# Patient Record
Sex: Male | Born: 1985 | Race: White | Hispanic: No | Marital: Married | Smoking: Never smoker
Health system: Southern US, Community
[De-identification: ages and names within clinical notes are randomized; demographics above are authoritative.]

## PROBLEM LIST (undated history)

## (undated) HISTORY — PX: VASECTOMY: SHX75

## (undated) HISTORY — PX: OTHER SURGICAL HISTORY: SHX169

---

## 2008-12-11 ENCOUNTER — Ambulatory Visit: Payer: Self-pay | Admitting: Internal Medicine

## 2010-11-21 IMAGING — CR RIGHT ANKLE - COMPLETE 3+ VIEW
1 series · 4 of 4 positions shown · non-contrast
Comparison: none

REASON FOR EXAM: swelling
COMMENTS:

PROCEDURE:     MDR - MDR ANKLE RIGHT COMPLETE  - December 11, 2008 [DATE]
RESULT:     Soft tissue swelling is appreciated within the medial and
lateral malleolar regions. There is no evidence of acute fracture or
dislocation.

[Series 1: view not recorded · 0.17mm/px · 4 of 4 slices shown]
[im 1/4]
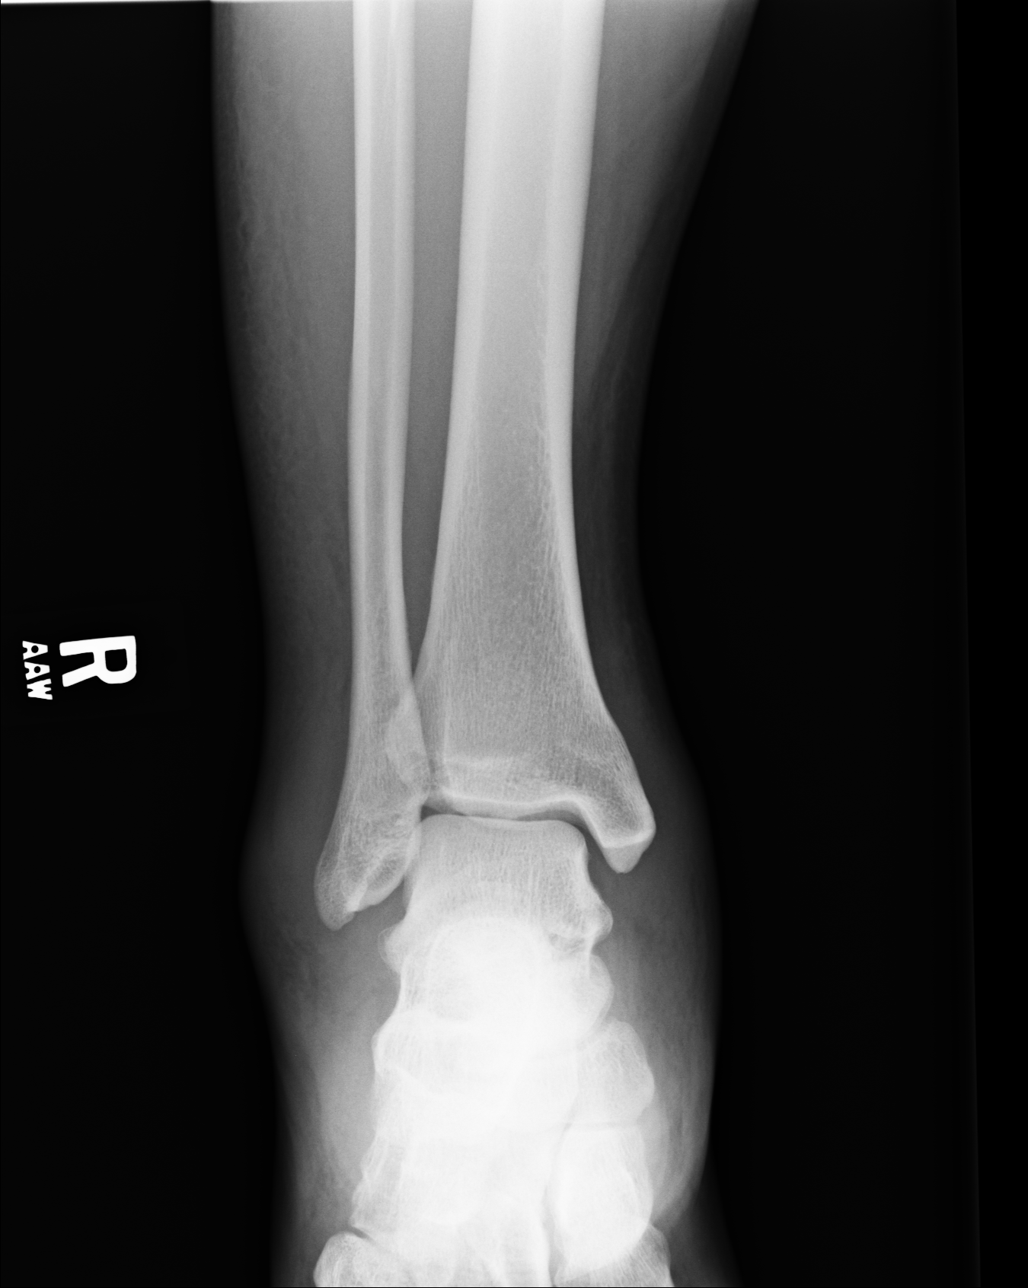
[im 2/4]
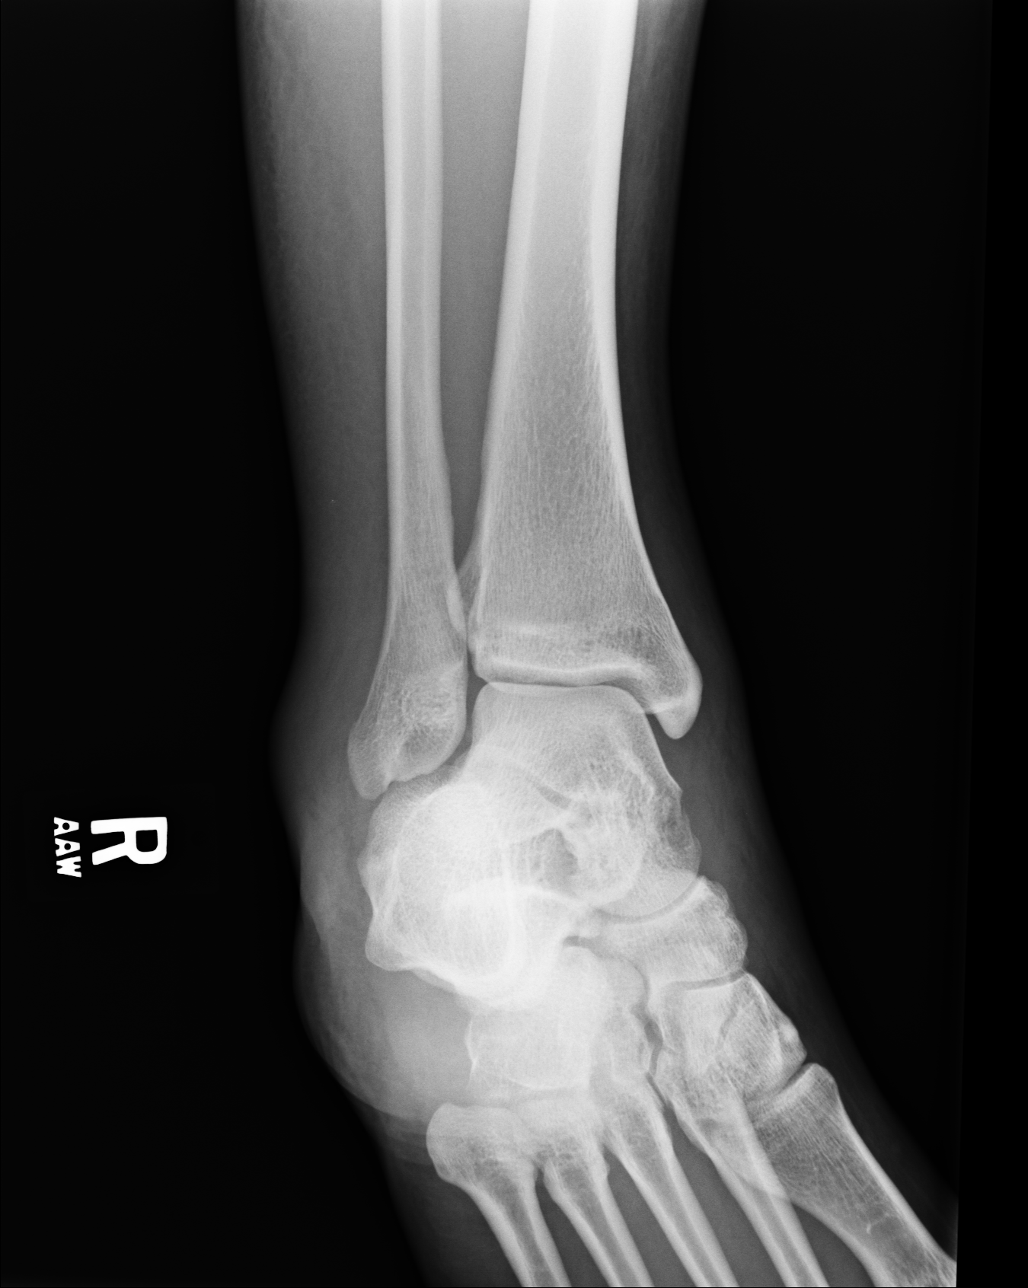
[im 3/4]
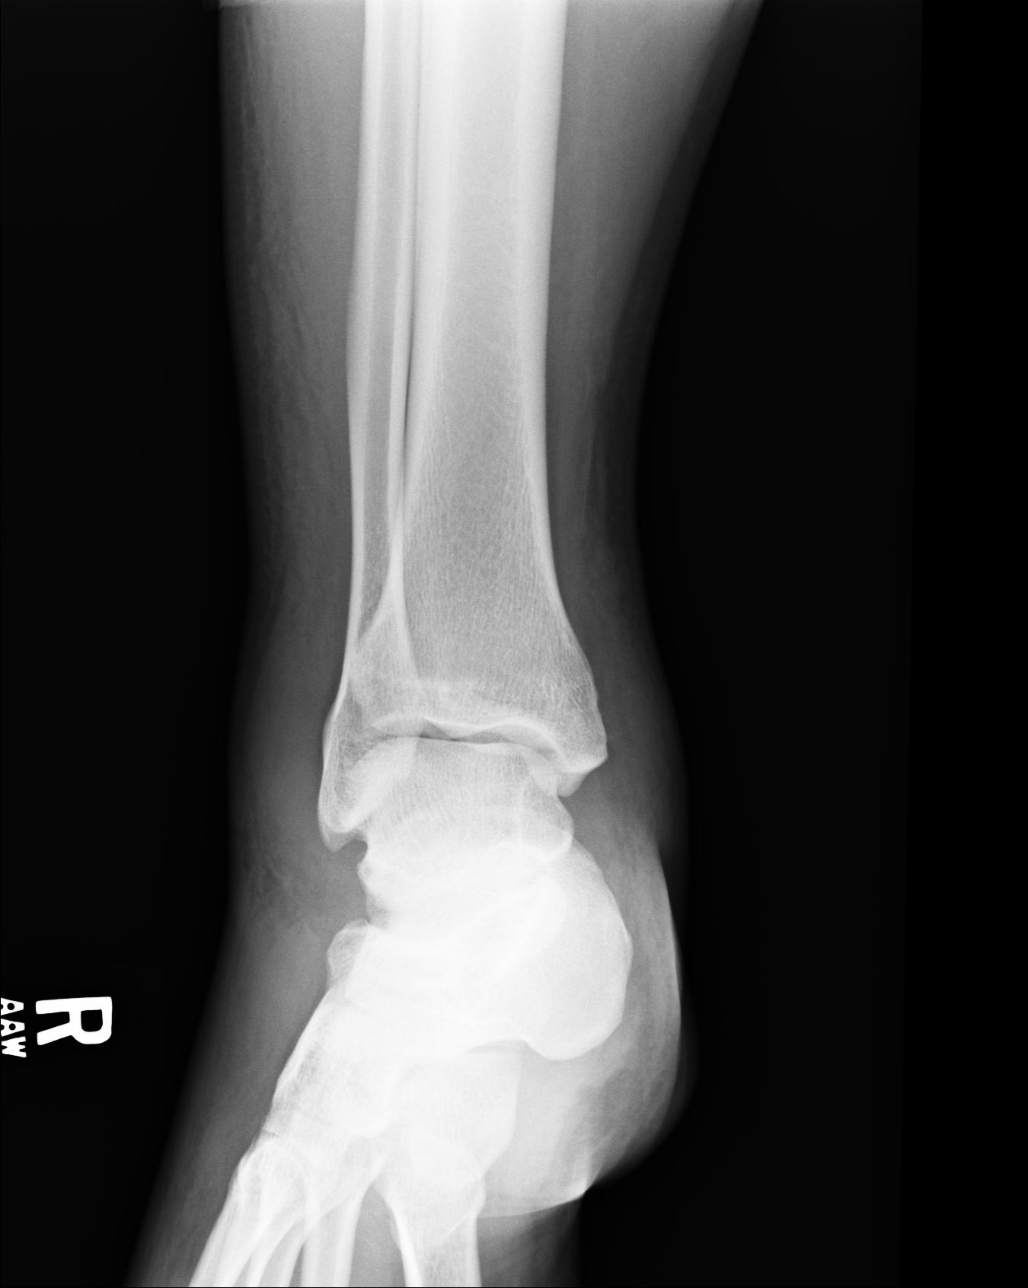
[im 4/4]
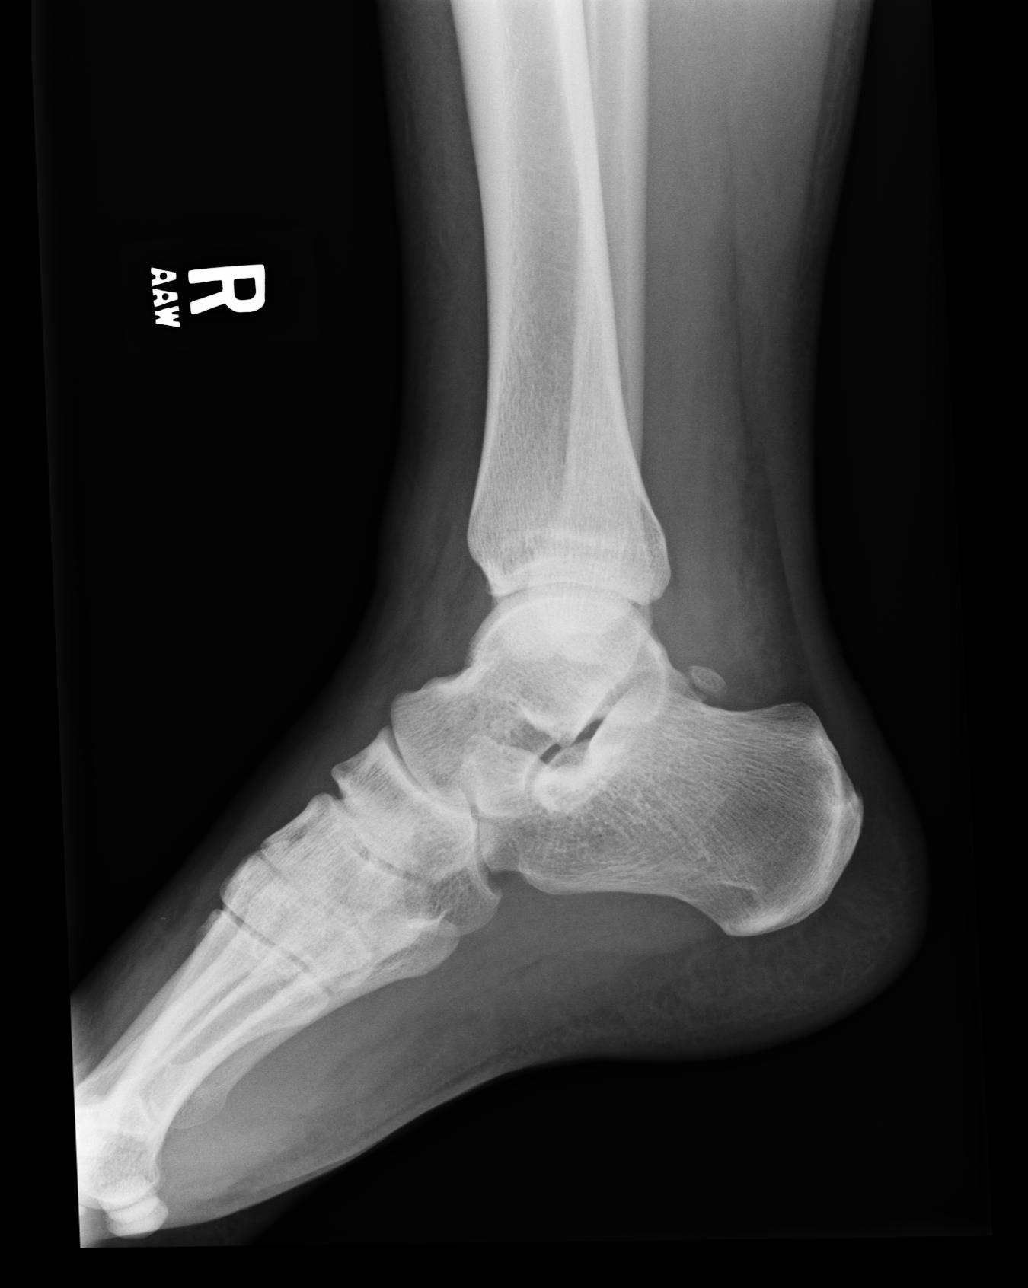

[4 of 4 positions shown; findings below may reference images not displayed]

IMPRESSION: 1. Findings possibly representing ligamentous injury within the medial and
lateral malleolar regions. No evidence of acute osseous abnormality is
appreciated.

## 2017-05-05 DIAGNOSIS — G510 Bell's palsy: Secondary | ICD-10-CM | POA: Diagnosis not present

## 2017-05-05 DIAGNOSIS — R202 Paresthesia of skin: Secondary | ICD-10-CM | POA: Diagnosis not present

## 2017-05-09 DIAGNOSIS — B349 Viral infection, unspecified: Secondary | ICD-10-CM | POA: Diagnosis not present

## 2017-05-09 DIAGNOSIS — G510 Bell's palsy: Secondary | ICD-10-CM | POA: Diagnosis not present

## 2019-10-19 DIAGNOSIS — Z131 Encounter for screening for diabetes mellitus: Secondary | ICD-10-CM | POA: Diagnosis not present

## 2019-10-19 DIAGNOSIS — Z1211 Encounter for screening for malignant neoplasm of colon: Secondary | ICD-10-CM | POA: Diagnosis not present

## 2019-10-19 DIAGNOSIS — E669 Obesity, unspecified: Secondary | ICD-10-CM | POA: Diagnosis not present

## 2019-10-19 DIAGNOSIS — I10 Essential (primary) hypertension: Secondary | ICD-10-CM | POA: Diagnosis not present

## 2019-11-12 DIAGNOSIS — I10 Essential (primary) hypertension: Secondary | ICD-10-CM | POA: Diagnosis not present

## 2019-11-12 DIAGNOSIS — Z6837 Body mass index (BMI) 37.0-37.9, adult: Secondary | ICD-10-CM | POA: Diagnosis not present

## 2019-11-12 DIAGNOSIS — E785 Hyperlipidemia, unspecified: Secondary | ICD-10-CM | POA: Diagnosis not present

## 2020-02-11 DIAGNOSIS — I10 Essential (primary) hypertension: Secondary | ICD-10-CM | POA: Diagnosis not present

## 2020-02-11 DIAGNOSIS — E785 Hyperlipidemia, unspecified: Secondary | ICD-10-CM | POA: Diagnosis not present

## 2020-02-11 DIAGNOSIS — Z6838 Body mass index (BMI) 38.0-38.9, adult: Secondary | ICD-10-CM | POA: Diagnosis not present

## 2020-08-10 DIAGNOSIS — I10 Essential (primary) hypertension: Secondary | ICD-10-CM | POA: Diagnosis not present

## 2020-08-10 DIAGNOSIS — R1011 Right upper quadrant pain: Secondary | ICD-10-CM | POA: Diagnosis not present

## 2020-08-10 DIAGNOSIS — E785 Hyperlipidemia, unspecified: Secondary | ICD-10-CM | POA: Diagnosis not present

## 2021-08-24 ENCOUNTER — Ambulatory Visit (INDEPENDENT_AMBULATORY_CARE_PROVIDER_SITE_OTHER): Payer: 59 | Admitting: Urology

## 2021-08-24 ENCOUNTER — Other Ambulatory Visit: Payer: Self-pay

## 2021-08-24 VITALS — BP 141/92 | Ht 73.0 in | Wt 286.0 lb

## 2021-08-24 DIAGNOSIS — Z3009 Encounter for other general counseling and advice on contraception: Secondary | ICD-10-CM

## 2021-08-24 NOTE — Patient Instructions (Signed)

## 2021-08-26 ENCOUNTER — Encounter: Payer: Self-pay | Admitting: Urology

## 2021-08-26 MED ORDER — DIAZEPAM 10 MG PO TABS: ORAL_TABLET | ORAL | 0 refills | Status: DC

## 2021-08-26 NOTE — Progress Notes (Signed)
08/24/2021 1:30 PM   Adam Short [birth year] BP:[id number]  Referring provider: No referring provider defined for this encounter.  Chief Complaint  Patient presents with   VAS Consult    HPI: Adam Short is a 36 y.o. male who presents for vasectomy counseling.  Married and wife due with 1st child this month Testicular torsion age 1 with bilateral orchiopexy. No chronic scrotal pain, epididymitis or orchitis No previous history inguinal hernia or other pelvic surgery No history of bleeding or clotting disorders   PMH: No past medical history on file.  Surgical History: Orhiopexy  Home Medications:  Allergies as of 08/24/2021   No Known Allergies      Medication List        Accurate as of August 24, 2021 11:59 PM. If you have any questions, ask your nurse or doctor.          EPINEPHrine 0.3 mg/0.3 mL Soaj injection Commonly known as: EPI-PEN Inject into the muscle as needed.   esomeprazole 40 MG capsule Commonly known as: NEXIUM Take 40 mg by mouth daily.        Allergies: No Known Allergies  Family History: No family history on file.  Social History:  has no history on file for tobacco use, alcohol use, and drug use.   Physical Exam: BP (!) 141/92    Ht 6\' 1"  (1.854 m)    Wt 286 lb (129.7 kg)    BMI 37.73 kg/m   Constitutional:  Alert and oriented, No acute distress. HEENT: [place] AT, moist mucus membranes.  Trachea midline, no masses. Cardiovascular: No clubbing, cyanosis, or edema. Respiratory: Normal respiratory effort, no increased work of breathing. GI: Abdomen is soft, nontender, nondistended, no abdominal masses GU: Phallus without lesions, testes descended bilaterally without masses or tenderness, spermatic cord/epididymis palpably normal bilaterally.  Vasa palpable bilaterally Skin: No rashes, bruises or suspicious lesions. Neurologic: Grossly intact, no focal deficits, moving all 4 extremities. Psychiatric: Normal mood and  affect.   Assessment & Plan:    1.  Undesired fertility Desires to schedule vasectomy We had a long discussion about vasectomy. We specifically discussed the procedure, recovery and the risks, benefits and alternatives of vasectomy. I explained that the procedure entails removal of a segment of each vas deferens, each of which conducts sperm, and that the purpose of this procedure is to cause sterility (inability to produce children or cause pregnancy). Vasectomy is intended to be permanent and irreversible form of contraception. Options for fertility after vasectomy include vasectomy reversal, or sperm retrieval with in vitro fertilization. These options are not always successful, and they may be expensive. We discussed reversible forms of birth control such as condoms, IUD or diaphragms, as well as the option of freezing sperm in a sperm bank prior to the vasectomy procedure. We discussed the importance of avoiding strenuous exercise for four days after vasectomy, and the importance of refraining from any form of ejaculation for seven days after vasectomy. I explained that vasectomy does not produce immediate sterility so another form of contraceptive must be used until sterility is assured by having semen checked for sperm. Thus, a post vasectomy semen analysis is necessary to confirm sterility. Rarely, vasectomy must be repeated. We discussed the approximately 1 in 2,000 risk of pregnancy after vasectomy for men who have post-vasectomy semen analysis showing absent sperm or rare non-motile sperm. Typical side effects include a small amount of oozing blood, some discomfort and mild swelling in the area of incision.  Vasectomy does  not affect sexual performance, function, please, sensation, interest, desire, satisfaction, penile erection, volume of semen or ejaculation. Other rare risks include allergy or adverse reaction to an anesthetic, testicular atrophy, hematoma, infection/abscess, prolonged  tenderness of the vas deferens, pain, swelling, painful nodule or scar (called sperm granuloma) or epididymtis. We discussed chronic testicular pain syndrome. This has been reported to occur in as many as 1-2% of men and may be permanent. This can be treated with medication, small procedures or (rarely) surgery. Valium 10 mg as a preprocedure anxiolytic sent to pharmacy and he was informed he would need a driver if utilizing this medication    Abbie Sons, [place] [address] [place], [place] [address] [phone]

## 2021-08-31 ENCOUNTER — Other Ambulatory Visit (HOSPITAL_BASED_OUTPATIENT_CLINIC_OR_DEPARTMENT_OTHER): Payer: Self-pay

## 2021-08-31 MED ORDER — ESOMEPRAZOLE MAGNESIUM 40 MG PO CPDR
DELAYED_RELEASE_CAPSULE | ORAL | 3 refills | Status: DC
  Filled 2021-08-31: qty 90, 90d supply, fill #0
  Filled 2021-11-24: qty 90, 90d supply, fill #1
  Filled 2022-02-14: qty 90, 90d supply, fill #2
  Filled 2022-05-15: qty 90, 90d supply, fill #3

## 2021-08-31 MED ORDER — OLMESARTAN MEDOXOMIL-HCTZ 40-12.5 MG PO TABS
1.0000 | ORAL_TABLET | Freq: Every day | ORAL | 3 refills | Status: DC
  Filled 2021-08-31 – 2021-09-03 (×2): qty 90, 90d supply, fill #0
  Filled 2022-02-14: qty 90, 90d supply, fill #1
  Filled 2022-05-15: qty 90, 90d supply, fill #2
  Filled 2022-08-12 – 2022-08-27 (×2): qty 90, 90d supply, fill #3

## 2021-09-03 ENCOUNTER — Other Ambulatory Visit (HOSPITAL_BASED_OUTPATIENT_CLINIC_OR_DEPARTMENT_OTHER): Payer: Self-pay

## 2021-10-11 ENCOUNTER — Ambulatory Visit (INDEPENDENT_AMBULATORY_CARE_PROVIDER_SITE_OTHER): Payer: 59 | Admitting: Urology

## 2021-10-11 ENCOUNTER — Encounter: Payer: Self-pay | Admitting: Urology

## 2021-10-11 ENCOUNTER — Other Ambulatory Visit: Payer: Self-pay

## 2021-10-11 VITALS — BP 145/92 | HR 108 | Ht 73.0 in | Wt 280.0 lb

## 2021-10-11 DIAGNOSIS — Z302 Encounter for sterilization: Secondary | ICD-10-CM | POA: Diagnosis not present

## 2021-10-11 MED ORDER — HYDROCODONE-ACETAMINOPHEN 5-325 MG PO TABS: 1.0000 | ORAL_TABLET | Freq: Four times a day (QID) | ORAL | 0 refills | Status: DC | PRN

## 2021-10-11 NOTE — Progress Notes (Signed)
Vasectomy Procedure Note  Indications: Chidi Chann is a 36 y.o. male who presents today for elective sterilization.  He has been consented for the procedure.  He is aware of the risks and benefits.  He had no additional questions.  He agrees to proceed.  He denies any other significant change since his last visit.  Pre-operative Diagnosis: Elective sterilization  Post-operative Diagnosis: Elective sterilization  Premedication: Valium 10 mg po  Surgeon: Nicki Reaper C. Davinity Fanara, M.D  Description: The patient was prepped and draped in the standard fashion.  The right vas deferens was identified and brought superiorly to the anterior scrotal skin.  The skin and vas were then anesthetized utilizing 5 ml 1% lidocaine.  A small stab incision was made and spread with the vas dissector.  The vas was grasped utilizing the vas clamp and elevated out of the incision.  The vas was dissected free from surrounding tissue and vessels and an ~1 cm segment was excised.  The vas lumens were cauterized utilizing electrocautery.  The distal segment was buried in the surrounding sheath with a 3-0 chromic suture.  No significant bleeding was observed.  The vas ends were then dropped back into the hemiscrotum.  The skin was closed with hemostatic pressure.  An identical procedure was performed on the contralateral side.  Clean dry gauze was applied to the incision sites.  There was slight oozing from the left and a single 3-0 chromic horizontal mattress suture was placed.  The patient tolerated the procedure well.  Complications:None  Recommendations: 1.  No lifting greater than 10 pounds or strenuous activity for 1 week. 2.  Scrotal support for 1-2 weeks. 3.  May shower in 24 hours; no bath, hot tub for 1 week 4.  No intercourse for at least 7 days and resume based on level of discomfort  5.  Continue alternate contraception for 12 weeks.  6.  Call for significant pain, swelling, redness, drainage or fever greater  than 100.5. 7.  Rx hydrocodone/APAP 5/325 1-2 every 6 hours prn pain. 8.  Follow-up semen analysis in 12 weeks.   John Giovanni, MD

## 2021-10-11 NOTE — Patient Instructions (Signed)

## 2021-11-26 ENCOUNTER — Other Ambulatory Visit (HOSPITAL_BASED_OUTPATIENT_CLINIC_OR_DEPARTMENT_OTHER): Payer: Self-pay

## 2021-12-05 ENCOUNTER — Ambulatory Visit: Payer: 59 | Admitting: Urology

## 2021-12-05 ENCOUNTER — Encounter: Payer: Self-pay | Admitting: Urology

## 2021-12-05 VITALS — BP 146/88 | HR 82 | Ht 73.0 in | Wt 280.0 lb

## 2021-12-05 DIAGNOSIS — N5082 Scrotal pain: Secondary | ICD-10-CM

## 2021-12-05 DIAGNOSIS — Z9852 Vasectomy status: Secondary | ICD-10-CM

## 2021-12-05 MED ORDER — CELECOXIB 200 MG PO CAPS: 200.0000 mg | ORAL_CAPSULE | Freq: Every day | ORAL | 0 refills | Status: DC

## 2021-12-05 NOTE — Patient Instructions (Signed)
You can look on GoodRX to see what pharmacy is cheaper if [facility] is to expensive.  ?Celebrex 200mg  1 tablet daily 30 tablets. ?

## 2021-12-06 NOTE — Progress Notes (Signed)
? ?  12/05/2021 ?5:25 PM  ? ?Adam Short ?[birth year] ?[id number] ? ?Referring provider: Julianne Handler, NP ?[address] ?[place],  [place] [address] ? ?Chief Complaint  ?Patient presents with  ? Testicle Pain  ? ? ?HPI: ?36 y.o. male presents for evaluation of scrotal pain. ? ?Vasectomy 10/11/2021 ?Did well post procedure however 3-4 weeks after procedure he began to have intermittent left scrotal pain ?No identifiable precipitating, aggravating or alleviating factors ?Pain rated mild-moderate ? ? ?PMH: ?History reviewed. No pertinent past medical history. ? ?Surgical History: ?History reviewed. No pertinent surgical history. ? ?Home Medications:  ?Allergies as of 12/05/2021   ?No Known Allergies ?  ? ?  ?Medication List  ?  ? ?  ? Accurate as of December 05, 2021 11:59 PM. If you have any questions, ask your nurse or doctor.  ?  ?  ? ?  ? ?STOP taking these medications   ? ?diazepam 10 MG tablet ?Commonly known as: VALIUM ?Stopped by: Riki Altes, MD ?  ?HYDROcodone-acetaminophen 5-325 MG tablet ?Commonly known as: NORCO/VICODIN ?Stopped by: Riki Altes, MD ?  ? ?  ? ?TAKE these medications   ? ?celecoxib 200 MG capsule ?Commonly known as: CeleBREX ?Take 1 capsule (200 mg total) by mouth daily. ?Started by: Riki Altes, MD ?  ?EPINEPHrine 0.3 mg/0.3 mL Soaj injection ?Commonly known as: EPI-PEN ?Inject into the muscle as needed. ?  ?esomeprazole 40 MG capsule ?Commonly known as: NEXIUM ?Take 1 capsule by mouth daily ?  ?olmesartan-hydrochlorothiazide 40-12.5 MG tablet ?Commonly known as: BENICAR HCT ?Take 1 tablet by mouth daily for hypertension ?  ? ?  ? ? ?Allergies: No Known Allergies ? ?Family History: ?History reviewed. No pertinent family history. ? ?Social History:  has no history on file for tobacco use, alcohol use, and drug use. ? ? ?Physical Exam: ?BP (!) 146/88   Pulse 82   Ht 6\' 1"  (1.854 m)   Wt 280 lb (127 kg)   BMI 36.94 kg/m?   ?Constitutional:  Alert and oriented, No acute  distress. ?HEENT: [place] AT, moist mucus membranes.  Trachea midline, no masses. ?Cardiovascular: No clubbing, cyanosis, or edema. ?Respiratory: Normal respiratory effort, no increased work of breathing. ?GU: Phallus without lesions.  Testes descended bilaterally without masses or tenderness.  Mild enlargement left epididymis without tenderness.  ~ 1 cm sperm granuloma which is nontender. ?Psychiatric: Normal mood and affect. ? ? ?Assessment & Plan:   ? ?1.  Left scrotal pain ?No epididymal, testicular or paratesticular tenderness ?Recommend scrotal support ?Trial Celebrex 200 mg daily x1 month ?We discussed the possibility of postvasectomy pain syndrome though he is only 2 months out from his procedure and hopefully this will improve ?He will call back regarding the efficacy of Celebrex ? ? ? , MD ? ?[facility] ?[address], [address] ?[place], [place] ?[phone] ? ?

## 2022-01-07 ENCOUNTER — Other Ambulatory Visit: Payer: Self-pay

## 2022-01-07 DIAGNOSIS — Z302 Encounter for sterilization: Secondary | ICD-10-CM

## 2022-01-08 ENCOUNTER — Other Ambulatory Visit: Payer: 59

## 2022-01-08 DIAGNOSIS — Z302 Encounter for sterilization: Secondary | ICD-10-CM

## 2022-01-11 ENCOUNTER — Telehealth: Payer: Self-pay

## 2022-01-11 LAB — POST-VAS SPERM EVALUATION,QUAL: Volume: 4.1 mL

## 2022-01-11 NOTE — Telephone Encounter (Signed)
-----   Message from Riki Altes, MD sent at 01/11/2022  1:49 PM EDT ----- Semen analysis showed no sperm present.  Okay to use vasectomy as primary contraception.

## 2022-01-11 NOTE — Telephone Encounter (Signed)
Patient aware and verbalized understanding. °

## 2022-02-14 ENCOUNTER — Other Ambulatory Visit (HOSPITAL_BASED_OUTPATIENT_CLINIC_OR_DEPARTMENT_OTHER): Payer: Self-pay

## 2022-03-22 ENCOUNTER — Other Ambulatory Visit (HOSPITAL_BASED_OUTPATIENT_CLINIC_OR_DEPARTMENT_OTHER): Payer: Self-pay

## 2022-03-22 MED ORDER — OLMESARTAN MEDOXOMIL-HCTZ 40-12.5 MG PO TABS: 1.0000 | ORAL_TABLET | Freq: Every day | ORAL | 3 refills | Status: DC

## 2022-03-22 MED ORDER — ESOMEPRAZOLE MAGNESIUM 40 MG PO CPDR: DELAYED_RELEASE_CAPSULE | ORAL | 3 refills | Status: DC

## 2022-04-11 DIAGNOSIS — E8881 Metabolic syndrome: Secondary | ICD-10-CM | POA: Insufficient documentation

## 2022-04-11 DIAGNOSIS — I10 Essential (primary) hypertension: Secondary | ICD-10-CM

## 2022-04-11 DIAGNOSIS — K219 Gastro-esophageal reflux disease without esophagitis: Secondary | ICD-10-CM | POA: Insufficient documentation

## 2022-04-11 DIAGNOSIS — E785 Hyperlipidemia, unspecified: Secondary | ICD-10-CM | POA: Insufficient documentation

## 2022-04-11 DIAGNOSIS — K760 Fatty (change of) liver, not elsewhere classified: Secondary | ICD-10-CM | POA: Insufficient documentation

## 2022-04-11 HISTORY — DX: Essential (primary) hypertension: I10

## 2022-04-11 HISTORY — DX: Metabolic syndrome: E88.81

## 2022-04-11 HISTORY — DX: Metabolic syndrome: E88.810

## 2022-04-12 ENCOUNTER — Ambulatory Visit: Payer: 59 | Admitting: Cardiology

## 2022-04-12 ENCOUNTER — Other Ambulatory Visit (HOSPITAL_COMMUNITY): Payer: Self-pay

## 2022-04-12 ENCOUNTER — Encounter: Payer: Self-pay | Admitting: Cardiology

## 2022-04-12 ENCOUNTER — Other Ambulatory Visit: Payer: Self-pay

## 2022-04-12 VITALS — BP 110/78 | HR 89 | Ht 72.0 in | Wt 278.6 lb

## 2022-04-12 DIAGNOSIS — I10 Essential (primary) hypertension: Secondary | ICD-10-CM | POA: Diagnosis not present

## 2022-04-12 DIAGNOSIS — G4733 Obstructive sleep apnea (adult) (pediatric): Secondary | ICD-10-CM | POA: Diagnosis not present

## 2022-04-12 DIAGNOSIS — E782 Mixed hyperlipidemia: Secondary | ICD-10-CM

## 2022-04-12 DIAGNOSIS — E8881 Metabolic syndrome: Secondary | ICD-10-CM | POA: Diagnosis not present

## 2022-04-12 NOTE — Patient Instructions (Signed)
Medication Instructions:  Your physician recommends that you continue on your current medications as directed. Please refer to the Current Medication list given to you today.  *If you need a refill on your cardiac medications before your next appointment, please call your pharmacy*   Lab Work: None If you have labs (blood work) drawn today and your tests are completely normal, you will receive your results only by: MyChart Message (if you have MyChart) OR A paper copy in the mail If you have any lab test that is abnormal or we need to change your treatment, we will call you to review the results.   Testing/Procedures: None   Follow-Up: At [facility], you and your health needs are our priority.  As part of our continuing mission to provide you with exceptional heart care, we have created designated Provider Care Teams.  These Care Teams include your primary Cardiologist (physician) and Advanced Practice Providers (APPs -  Physician Assistants and Nurse Practitioners) who all work together to provide you with the care you need, when you need it.  We recommend signing up for the patient portal called "MyChart".  Sign up information is provided on this After Visit Summary.  MyChart is used to connect with patients for Virtual Visits (Telemedicine).  Patients are able to view lab/test results, encounter notes, upcoming appointments, etc.  Non-urgent messages can be sent to your provider as well.   To learn more about what you can do with MyChart, go to [email or web address].    Your next appointment:   6 month(s)  The format for your next appointment:   In Person  Provider:   Robert Krasowski, MD    Other Instructions None  Important Information About Sugar       

## 2022-04-12 NOTE — Progress Notes (Signed)
Cardiology Consultation:    Date:  04/12/2022   ID:  Adam Short, DOB 20-Jul-1986, MRN [id number]  PCP:  Julianne Handler, NP  Cardiologist:  Gypsy Balsam, MD   Referring MD: Julianne Handler, NP   Chief Complaint  Patient presents with   Heart Eval     Family hx of Heart disease    History of Present Illness:    Adam Short is a 36 y.o. male who is being seen today for the evaluation of risk factors modification at the request of Julianne Handler, NP.  With past medical history significant for obesity, essential hypertension, obstructive sleep apnea which appears to be severe, dyslipidemia.  He would like to be established as a patient in our practice since he does have multiple family members having premature coronary artery disease.  Overall he is asymptomatic.  He denies have any chest pain tightness squeezing pressure burning chest.  He works hard physically have no difficulty doing it.  Interestingly recently he requested to have a sleep study done because of symptoms that he experienced when he was find to have severe sleep apnea he is waiting for CPAP mask.  Past Medical History:  Diagnosis Date   Essential hypertension 04/11/2022   Metabolic syndrome 04/11/2022    Past Surgical History:  Procedure Laterality Date   testicular tube surgery     VASECTOMY      Current Medications: Current Meds  Medication Sig   cetirizine (ZYRTEC) 10 MG chewable tablet Chew 10 mg by mouth daily.   EPINEPHrine 0.3 mg/0.3 mL IJ SOAJ injection Inject 0.3 mg into the muscle as needed for anaphylaxis.   esomeprazole (NEXIUM) 40 MG capsule Take 1 capsule by mouth daily (Patient taking differently: Take 40 mg by mouth daily at 12 noon.)   olmesartan-hydrochlorothiazide (BENICAR HCT) 40-12.5 MG tablet Take 1 tablet by mouth daily for hypertension     Allergies:   Bee venom   Social History   Socioeconomic History   Marital status: Married    Spouse name: Not on file   Number of  children: Not on file   Years of education: Not on file   Highest education level: Not on file  Occupational History   Not on file  Tobacco Use   Smoking status: Never   Smokeless tobacco: Never  Substance and Sexual Activity   Alcohol use: Yes    Alcohol/week: 2.0 standard drinks of alcohol    Types: 2 Cans of beer per week   Drug use: Never   Sexual activity: Yes  Other Topics Concern   Not on file  Social History Narrative   Not on file   Social Determinants of Health   Financial Resource Strain: Not on file  Food Insecurity: Not on file  Transportation Needs: Not on file  Physical Activity: Not on file  Stress: Not on file  Social Connections: Not on file     Family History: The patient's family history includes Cancer in his father; Heart disease in his maternal grandfather; Hypertension in his mother. ROS:   Please see the history of present illness.    All 14 point review of systems negative except as described per history of present illness.  EKGs/Labs/Other Studies Reviewed:    The following studies were reviewed today:   EKG:  EKG is  ordered today.  The ekg ordered today demonstrates normal sinus rhythm, normal P interval, normal QS complex duration morphology no ST segment changes  Recent Labs: No  results found for requested labs within last 365 days.  Recent Lipid Panel No results found for: "CHOL", "TRIG", "HDL", "CHOLHDL", "VLDL", "LDLCALC", "LDLDIRECT"  Physical Exam:    VS:  BP 110/78 (BP Location: Left Arm, Patient Position: Sitting)   Pulse 89   Ht 6' (1.829 m)   Wt 278 lb 9.6 oz (126.4 kg)   SpO2 92%   BMI 37.78 kg/m     Wt Readings from Last 3 Encounters:  04/12/22 278 lb 9.6 oz (126.4 kg)  12/05/21 280 lb (127 kg)  10/11/21 280 lb (127 kg)     GEN:  Well nourished, well developed in no acute distress HEENT: Normal NECK: No JVD; No carotid bruits LYMPHATICS: No lymphadenopathy CARDIAC: RRR, no murmurs, no rubs, no  gallops RESPIRATORY:  Clear to auscultation without rales, wheezing or rhonchi  ABDOMEN: Soft, non-tender, non-distended MUSCULOSKELETAL:  No edema; No deformity  SKIN: Warm and dry NEUROLOGIC:  Alert and oriented x 3 PSYCHIATRIC:  Normal affect   ASSESSMENT:    1. Essential hypertension   2. Obstructive sleep apnea   3. Mixed hyperlipidemia   4. Metabolic syndrome    PLAN:    In order of problems listed above:  Essential hypertension blood pressure is excellently controlled with medications which I will continue. Dyslipidemia I did review his K PN which show LDL of 122 HDL 34, he is not on any statin however my hope is if he will manage his sleep apnea appropriately he will have more energy will be able to lose some more weight which should help with overall metabolic situation.  He does have most likely metabolic syndrome and weight loss will be very beneficial to him. 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  I did discuss basic of Mediterranean diet with him as well as with his wife I see him back in about 6 months at that time we will reassess the situation and I hope CPAP will help him significantly.   Medication Adjustments/Labs and Tests Ordered: Current medicines are reviewed at length with the patient today.  Concerns regarding medicines are outlined above.  Orders Placed This Encounter  Procedures   EKG 12-Lead   No orders of the defined types were placed in this encounter.   Signed, Georgeanna Lea, MD, [facility]. 04/12/2022 4:35 PM    [facility]

## 2022-04-30 ENCOUNTER — Telehealth: Payer: Self-pay

## 2022-04-30 DIAGNOSIS — R5383 Other fatigue: Secondary | ICD-10-CM

## 2022-04-30 NOTE — Telephone Encounter (Signed)
Thyroid panel with TSH ordered per Dr. Bing Matter.

## 2022-05-01 LAB — THYROID PANEL WITH TSH
Free Thyroxine Index: 1.5 (ref 1.2–4.9)
T3 Uptake Ratio: 23 % — ABNORMAL LOW (ref 24–39)
T4, Total: 6.4 ug/dL (ref 4.5–12.0)
TSH: 2.04 u[IU]/mL (ref 0.450–4.500)

## 2022-05-02 ENCOUNTER — Telehealth: Payer: Self-pay

## 2022-05-02 NOTE — Telephone Encounter (Signed)
Results reviewed with pt's spouse-per DPR- as per Dr. Krasowski's note.  Pt's spouse verbalized understanding and had no additional questions. Routed to PCP  

## 2022-05-15 ENCOUNTER — Other Ambulatory Visit (HOSPITAL_BASED_OUTPATIENT_CLINIC_OR_DEPARTMENT_OTHER): Payer: Self-pay

## 2022-08-12 ENCOUNTER — Other Ambulatory Visit (HOSPITAL_COMMUNITY): Payer: Self-pay

## 2022-08-13 ENCOUNTER — Other Ambulatory Visit: Payer: Self-pay

## 2022-08-13 ENCOUNTER — Encounter (HOSPITAL_COMMUNITY): Payer: Self-pay

## 2022-08-13 ENCOUNTER — Other Ambulatory Visit (HOSPITAL_COMMUNITY): Payer: Self-pay

## 2022-08-14 ENCOUNTER — Other Ambulatory Visit (HOSPITAL_COMMUNITY): Payer: Self-pay

## 2022-08-14 MED ORDER — ESOMEPRAZOLE MAGNESIUM 40 MG PO CPDR
40.0000 mg | DELAYED_RELEASE_CAPSULE | Freq: Every day | ORAL | 3 refills | Status: DC
  Filled 2022-08-14 – 2022-08-20 (×2): qty 90, 90d supply, fill #0
  Filled 2022-11-18: qty 90, 90d supply, fill #1

## 2022-08-20 ENCOUNTER — Other Ambulatory Visit: Payer: Self-pay

## 2022-08-20 ENCOUNTER — Other Ambulatory Visit (HOSPITAL_COMMUNITY): Payer: Self-pay

## 2022-08-21 ENCOUNTER — Other Ambulatory Visit: Payer: Self-pay

## 2022-08-22 ENCOUNTER — Encounter (HOSPITAL_COMMUNITY): Payer: Self-pay

## 2022-08-22 ENCOUNTER — Other Ambulatory Visit (HOSPITAL_COMMUNITY): Payer: Self-pay

## 2022-08-23 ENCOUNTER — Other Ambulatory Visit (HOSPITAL_COMMUNITY): Payer: Self-pay

## 2022-08-27 ENCOUNTER — Other Ambulatory Visit (HOSPITAL_COMMUNITY): Payer: Self-pay

## 2022-08-29 NOTE — Progress Notes (Addendum)
Subjective:  Patient ID: Adam Short, male    DOB: February 22, 1986  Age: 37 y.o. MRN: [id number]  Chief Complaints:  Patient is establishing as a new patient.  History of Present illness:   Patient is here to establish the care, he was going to another practice but recent changes in his insurance he needs to find something else. He has a history of HYPERTENSION, hyperlipidemia, Sleep apnea and GERD. He was diagnosed HYPERTENSION 3 years back. He said he works everyday from 7 am to 7 pm and do not have a time for exercise. He does not watch his diet and use soda  regularly. He does not smoke but he drinks 6 beers everyday. He lives home with a wife and one year old son. His wife works with [employer] as a Network engineer.  He has a family history of HYPERTENSION, colon cancer but not DM.   Using CPAP since September 2023, using every single night for his sleep apnea.    Current Outpatient Medications on File Prior to Visit  Medication Sig Dispense Refill   cetirizine (ZYRTEC) 10 MG chewable tablet Chew 10 mg by mouth daily.     EPINEPHrine 0.3 mg/0.3 mL IJ SOAJ injection Inject 0.3 mg into the muscle as needed for anaphylaxis.     esomeprazole (NEXIUM) 40 MG capsule Take 1 capsule (40 mg total) by mouth daily. 90 capsule 3   olmesartan-hydrochlorothiazide (BENICAR HCT) 40-12.5 MG tablet Take 1 tablet by mouth daily for hypertension 90 tablet 3   No current facility-administered medications on file prior to visit.  . Social History   Socioeconomic History   Marital status: Married    Spouse name: Not on file   Number of children: Not on file   Years of education: Not on file   Highest education level: Not on file  Occupational History   Not on file  Tobacco Use   Smoking status: Never   Smokeless tobacco: Never  Substance and Sexual Activity   Alcohol use: Yes    Alcohol/week: 2.0 standard drinks of alcohol    Types: 2 Cans of beer per week   Drug use: Never   Sexual activity:  Yes  Other Topics Concern   Not on file  Social History Narrative   Not on file   Social Determinants of Health   Financial Resource Strain: Not on file  Food Insecurity: Not on file  Transportation Needs: Not on file  Physical Activity: Not on file  Stress: Not on file  Social Connections: Not on file   Past Medical History:  Diagnosis Date   Essential hypertension 3/71/6967   Metabolic syndrome 8/93/8101   Family History  Problem Relation Age of Onset   Hypertension Mother    Cancer Father    Heart disease Maternal Grandfather     Review of Systems  Constitutional:  Negative for chills, diaphoresis, fatigue and fever.  HENT:  Negative for congestion, ear pain and sore throat.   Respiratory:  Negative for cough and shortness of breath.   Cardiovascular:  Negative for chest pain and palpitations.  Gastrointestinal:  Negative for abdominal pain, constipation, diarrhea, nausea and vomiting.  Endocrine: Negative for polydipsia, polyphagia and polyuria.  Genitourinary:  Negative for dysuria and frequency.  Musculoskeletal:  Negative for arthralgias, back pain and myalgias.  Neurological:  Negative for dizziness and headaches.  Psychiatric/Behavioral:  Negative for dysphoric mood. The patient is not nervous/anxious.      Objective:  BP 126/84 (BP Location: Left  Arm, Patient Position: Sitting, Cuff Size: Large)   Pulse 95   Temp (!) 97.1 F (36.2 C) (Temporal)   Ht 6' (1.829 m)   Wt 290 lb (131.5 kg)   SpO2 97%   BMI 39.33 kg/m      08/30/2022    8:44 AM 04/12/2022    3:54 PM 12/05/2021    2:56 PM  BP/Weight  Systolic BP 126 110 146  Diastolic BP 84 78 88  Wt. (Lbs) 290 278.6 280  BMI 39.33 kg/m2 37.78 kg/m2 36.94 kg/m2    Physical Exam Vitals reviewed.  Constitutional:      Appearance: He is obese.  HENT:     Right Ear: Tympanic membrane normal.     Left Ear: Tympanic membrane normal.     Nose: Nose normal.     Mouth/Throat:     Pharynx: No  oropharyngeal exudate or posterior oropharyngeal erythema.  Eyes:     Conjunctiva/sclera: Conjunctivae normal.  Neck:     Vascular: No carotid bruit.  Cardiovascular:     Rate and Rhythm: Normal rate and regular rhythm.     Pulses: Normal pulses.     Heart sounds: Normal heart sounds.  Pulmonary:     Effort: Pulmonary effort is normal.     Breath sounds: Normal breath sounds.  Abdominal:     General: Bowel sounds are normal.     Palpations: There is no mass.     Tenderness: There is no abdominal tenderness.  Musculoskeletal:     Cervical back: Normal range of motion.  Skin:    Findings: No lesion.  Neurological:     Mental Status: He is alert and oriented to person, place, and time.  Psychiatric:        Mood and Affect: Mood normal.        Behavior: Behavior normal.    Lab Results  Component Value Date   WBC 6.0 08/30/2022   HGB 16.1 08/30/2022   HCT 46.7 08/30/2022   PLT 227 08/30/2022   GLUCOSE 95 08/30/2022   ALT 66 (H) 08/30/2022   AST 46 (H) 08/30/2022   NA 139 08/30/2022   K 4.5 08/30/2022   CL 102 08/30/2022   CREATININE 1.05 08/30/2022   BUN 11 08/30/2022   CO2 21 08/30/2022   TSH 2.040 04/30/2022   HGBA1C 5.8 (H) 08/30/2022   Assessment & Plan:  Encounter for physical examination -     Comprehensive metabolic panel -     CBC with Differential/Platelet  Essential hypertension Assessment & Plan: Taking Benicar-HCT daily CBC and CMP checked today  Nutrition: Stressed importance of moderation in sodium intake, saturated fat and cholesterol, caloric balance, sufficient intake of complex carbohydrates, fiber, calcium and iron.   Exercise: Stressed the importance of regular exercise.     Orders: -     Comprehensive metabolic panel -     CBC with Differential/Platelet  Gastroesophageal reflux disease with esophagitis, unspecified whether hemorrhage Assessment & Plan: Well controlled, no symptoms of GERD Continue Nexium    Mixed  hyperlipidemia Assessment & Plan: Will check lipid on next visit (09/06/2022)  Nutrition: Stressed importance of moderation in sodium intake, saturated fat and cholesterol, caloric balance, sufficient intake of complex carbohydrates, fiber, calcium and iron.   Exercise: Stressed the importance of regular exercise.     Orders: -     Hemoglobin A1c  Obstructive sleep apnea Assessment & Plan: Continue using CPAP Exercise regularly and watch your diet to reduce the current weight  Class 2 severe obesity due to excess calories with serious comorbidity and body mass index (BMI) of 39.0 to 39.9 in adult [facility]) Assessment & Plan: Will check fasting lipid next week  Nutrition: Stressed importance of moderation in sodium intake, saturated fat and cholesterol, caloric balance, sufficient intake of complex carbohydrates, fiber, calcium and iron.   Exercise: Stressed the importance of regular exercise.     Orders: -     Hemoglobin A1c  Excessive drinking of alcohol Assessment & Plan: Suggested to cut off the drinking from 6 beers per day to 5 beers per day for a week then 4 beers per day for another week, then 3 beers per day to another week and see how he does with that. Suggested about joining AA groups    Follow-up: Jan 19 to discuss the labs and lipid profile I, Carolle Ishii have reviewed all documentation for this visit. The documentation on 09/05/22   for the exam, diagnosis, procedures, and orders are all accurate and complete.     AVS was given to patient prior to departure.  Neil Crouch, DNP, [place] [phone]

## 2022-08-30 ENCOUNTER — Ambulatory Visit: Payer: 59 | Admitting: Nurse Practitioner

## 2022-08-30 ENCOUNTER — Ambulatory Visit (INDEPENDENT_AMBULATORY_CARE_PROVIDER_SITE_OTHER): Payer: 59 | Admitting: Nurse Practitioner

## 2022-08-30 ENCOUNTER — Encounter: Payer: Self-pay | Admitting: Nurse Practitioner

## 2022-08-30 VITALS — BP 126/84 | HR 95 | Temp 97.1°F | Ht 72.0 in | Wt 290.0 lb

## 2022-08-30 DIAGNOSIS — G4733 Obstructive sleep apnea (adult) (pediatric): Secondary | ICD-10-CM | POA: Diagnosis not present

## 2022-08-30 DIAGNOSIS — I10 Essential (primary) hypertension: Secondary | ICD-10-CM | POA: Diagnosis not present

## 2022-08-30 DIAGNOSIS — E782 Mixed hyperlipidemia: Secondary | ICD-10-CM | POA: Diagnosis not present

## 2022-08-30 DIAGNOSIS — F1010 Alcohol abuse, uncomplicated: Secondary | ICD-10-CM | POA: Diagnosis not present

## 2022-08-30 DIAGNOSIS — K2100 Gastro-esophageal reflux disease with esophagitis, without bleeding: Secondary | ICD-10-CM

## 2022-08-30 DIAGNOSIS — Z0000 Encounter for general adult medical examination without abnormal findings: Secondary | ICD-10-CM

## 2022-08-30 DIAGNOSIS — Z6839 Body mass index (BMI) 39.0-39.9, adult: Secondary | ICD-10-CM | POA: Diagnosis not present

## 2022-08-30 DIAGNOSIS — E66812 Obesity, class 2: Secondary | ICD-10-CM | POA: Insufficient documentation

## 2022-08-30 LAB — CBC WITH DIFFERENTIAL/PLATELET
Basophils Absolute: 0 10*3/uL (ref 0.0–0.2)
Basos: 1 %
EOS (ABSOLUTE): 0.2 10*3/uL (ref 0.0–0.4)
Eos: 4 %
Hematocrit: 46.7 % (ref 37.5–51.0)
Hemoglobin: 16.1 g/dL (ref 13.0–17.7)
Immature Grans (Abs): 0 10*3/uL (ref 0.0–0.1)
Immature Granulocytes: 1 %
Lymphocytes Absolute: 1.6 10*3/uL (ref 0.7–3.1)
Lymphs: 27 %
MCH: 30.3 pg (ref 26.6–33.0)
MCHC: 34.5 g/dL (ref 31.5–35.7)
MCV: 88 fL (ref 79–97)
Monocytes Absolute: 0.9 10*3/uL (ref 0.1–0.9)
Monocytes: 14 %
Neutrophils Absolute: 3.3 10*3/uL (ref 1.4–7.0)
Neutrophils: 53 %
Platelets: 227 10*3/uL (ref 150–450)
RBC: 5.32 x10E6/uL (ref 4.14–5.80)
RDW: 12.1 % (ref 11.6–15.4)
WBC: 6 10*3/uL (ref 3.4–10.8)

## 2022-08-30 LAB — HEMOGLOBIN A1C
Est. average glucose Bld gHb Est-mCnc: 120 mg/dL
Hgb A1c MFr Bld: 5.8 % — ABNORMAL HIGH (ref 4.8–5.6)

## 2022-08-30 LAB — COMPREHENSIVE METABOLIC PANEL
ALT: 66 IU/L — ABNORMAL HIGH (ref 0–44)
AST: 46 IU/L — ABNORMAL HIGH (ref 0–40)
Albumin/Globulin Ratio: 1.7 (ref 1.2–2.2)
Albumin: 4.6 g/dL (ref 4.1–5.1)
Alkaline Phosphatase: 124 IU/L — ABNORMAL HIGH (ref 44–121)
BUN/Creatinine Ratio: 10 (ref 9–20)
BUN: 11 mg/dL (ref 6–20)
Bilirubin Total: 0.3 mg/dL (ref 0.0–1.2)
CO2: 21 mmol/L (ref 20–29)
Calcium: 9.1 mg/dL (ref 8.7–10.2)
Chloride: 102 mmol/L (ref 96–106)
Creatinine, Ser: 1.05 mg/dL (ref 0.76–1.27)
Globulin, Total: 2.7 g/dL (ref 1.5–4.5)
Glucose: 95 mg/dL (ref 70–99)
Potassium: 4.5 mmol/L (ref 3.5–5.2)
Sodium: 139 mmol/L (ref 134–144)
Total Protein: 7.3 g/dL (ref 6.0–8.5)
eGFR: 93 mL/min/{1.73_m2} (ref >59)

## 2022-08-30 NOTE — Patient Instructions (Addendum)
Follow up 1/19 for lipid profile lab work and to discuss ongoing problem  Preventive Care 39-39 Years Old, Male Preventive care refers to lifestyle choices and visits with your health care provider that can promote health and wellness. Preventive care visits are also called wellness exams. What can I expect for my preventive care visit? Counseling During your preventive care visit, your health care provider may ask about your: Medical history, including: Past medical problems. Family medical history. Current health, including: Emotional well-being. Home life and relationship well-being. Sexual activity. Lifestyle, including: Alcohol, nicotine or tobacco, and drug use. Access to firearms. Diet, exercise, and sleep habits. Safety issues such as seatbelt and bike helmet use. Sunscreen use. Work and work Astronomer. Physical exam Your health care provider may check your: Height and weight. These may be used to calculate your BMI (body mass index). BMI is a measurement that tells if you are at a healthy weight. Waist circumference. This measures the distance around your waistline. This measurement also tells if you are at a healthy weight and may help predict your risk of certain diseases, such as type 2 diabetes and high blood pressure. Heart rate and blood pressure. Body temperature. Skin for abnormal spots. What immunizations do I need?  Vaccines are usually given at various ages, according to a schedule. Your health care provider will recommend vaccines for you based on your age, medical history, and lifestyle or other factors, such as travel or where you work. What tests do I need? Screening Your health care provider may recommend screening tests for certain conditions. This may include: Lipid and cholesterol levels. Diabetes screening. This is done by checking your blood sugar (glucose) after you have not eaten for a while (fasting). Hepatitis B test. Hepatitis C test. HIV  (human immunodeficiency virus) test. STI (sexually transmitted infection) testing, if you are at risk. Talk with your health care provider about your test results, treatment options, and if necessary, the need for more tests. Follow these instructions at home: Eating and drinking  Eat a healthy diet that includes fresh fruits and vegetables, whole grains, lean protein, and low-fat dairy products. Drink enough fluid to keep your urine pale yellow. Take vitamin and mineral supplements as recommended by your health care provider. Do not drink alcohol if your health care provider tells you not to drink. If you drink alcohol: Limit how much you have to 0-2 drinks a day. Know how much alcohol is in your drink. In the [place] one drink equals one 12 oz bottle of beer (355 mL), one 5 oz glass of wine (148 mL), or one 1 oz glass of hard liquor (44 mL). Lifestyle Brush your teeth every morning and night with fluoride toothpaste. Floss one time each day. Exercise for at least 30 minutes 5 or more days each week. Do not use any products that contain nicotine or tobacco. These products include cigarettes, chewing tobacco, and vaping devices, such as e-cigarettes. If you need help quitting, ask your health care provider. Do not use drugs. If you are sexually active, practice safe sex. Use a condom or other form of protection to prevent STIs. Find healthy ways to manage stress, such as: Meditation, yoga, or listening to music. Journaling. Talking to a trusted person. Spending time with friends and family. Minimize exposure to UV radiation to reduce your risk of skin cancer. Safety Always wear your seat belt while driving or riding in a vehicle. Do not drive: If you have been drinking alcohol. Do not ride  with someone who has been drinking. If you have been using any mind-altering substances or drugs. While texting. When you are tired or distracted. Wear a helmet and other protective equipment during  sports activities. If you have firearms in your house, make sure you follow all gun safety procedures. Seek help if you have been physically or sexually abused. What's next? Go to your health care provider once a year for an annual wellness visit. Ask your health care provider how often you should have your eyes and teeth checked. Stay up to date on all vaccines. This information is not intended to replace advice given to you by your health care provider. Make sure you discuss any questions you have with your health care provider. Document Revised: 01/31/2021 Document Reviewed: 01/31/2021 Elsevier Patient Education  [place] Eating Plan DASH stands for Dietary Approaches to Stop Hypertension. The DASH eating plan is a healthy eating plan that has been shown to: Reduce high blood pressure (hypertension). Reduce your risk for type 2 diabetes, heart disease, and stroke. Help with weight loss. What are tips for following this plan? Reading food labels Check food labels for the amount of salt (sodium) per serving. Choose foods with less than 5 percent of the Daily Value of sodium. Generally, foods with less than 300 milligrams (mg) of sodium per serving fit into this eating plan. To find whole grains, look for the word "whole" as the first word in the ingredient list. Shopping Buy products labeled as "low-sodium" or "no salt added." Buy fresh foods. Avoid canned foods and pre-made or frozen meals. Cooking Avoid adding salt when cooking. Use salt-free seasonings or herbs instead of table salt or sea salt. Check with your health care provider or pharmacist before using salt substitutes. Do not fry foods. Cook foods using healthy methods such as baking, boiling, grilling, roasting, and broiling instead. Cook with heart-healthy oils, such as olive, canola, avocado, soybean, or sunflower oil. Meal planning  Eat a balanced diet that includes: 4 or more servings of fruits and 4  or more servings of vegetables each day. Try to fill one-half of your plate with fruits and vegetables. 6-8 servings of whole grains each day. Less than 6 oz (170 g) of lean meat, poultry, or fish each day. A 3-oz (85-g) serving of meat is about the same size as a deck of cards. One egg equals 1 oz (28 g). 2-3 servings of low-fat dairy each day. One serving is 1 cup (237 mL). 1 serving of nuts, seeds, or beans 5 times each week. 2-3 servings of heart-healthy fats. Healthy fats called omega-3 fatty acids are found in foods such as walnuts, flaxseeds, fortified milks, and eggs. These fats are also found in cold-water fish, such as sardines, salmon, and mackerel. Limit how much you eat of: Canned or prepackaged foods. Food that is high in trans fat, such as some fried foods. Food that is high in saturated fat, such as fatty meat. Desserts and other sweets, sugary drinks, and other foods with added sugar. Full-fat dairy products. Do not salt foods before eating. Do not eat more than 4 egg yolks a week. Try to eat at least 2 vegetarian meals a week. Eat more home-cooked food and less restaurant, buffet, and fast food. Lifestyle When eating at a restaurant, ask that your food be prepared with less salt or no salt, if possible. If you drink alcohol: Limit how much you use to: 0-1 drink a day for women  who are not pregnant. 0-2 drinks a day for men. Be aware of how much alcohol is in your drink. In the [place] one drink equals one 12 oz bottle of beer (355 mL), one 5 oz glass of wine (148 mL), or one 1 oz glass of hard liquor (44 mL). General information Avoid eating more than 2,300 mg of salt a day. If you have hypertension, you may need to reduce your sodium intake to 1,500 mg a day. Work with your health care provider to maintain a healthy body weight or to lose weight. Ask what an ideal weight is for you. Get at least 30 minutes of exercise that causes your heart to beat faster (aerobic  exercise) most days of the week. Activities may include walking, swimming, or biking. Work with your health care provider or dietitian to adjust your eating plan to your individual calorie needs. What foods should I eat? Fruits All fresh, dried, or frozen fruit. Canned fruit in natural juice (without added sugar). Vegetables Fresh or frozen vegetables (raw, steamed, roasted, or grilled). Low-sodium or reduced-sodium tomato and vegetable juice. Low-sodium or reduced-sodium tomato sauce and tomato paste. Low-sodium or reduced-sodium canned vegetables. Grains Whole-grain or whole-wheat bread. Whole-grain or whole-wheat pasta. Brown rice. [place]. Bulgur. Whole-grain and low-sodium cereals. Pita bread. Low-fat, low-sodium crackers. Whole-wheat flour tortillas. Meats and other proteins Skinless chicken or [place]. Ground chicken or [place]. Pork with fat trimmed off. Fish and seafood. Egg whites. Dried beans, peas, or lentils. Unsalted nuts, nut butters, and seeds. Unsalted canned beans. Lean cuts of beef with fat trimmed off. Low-sodium, lean precooked or cured meat, such as sausages or meat loaves. Dairy Low-fat (1%) or fat-free (skim) milk. Reduced-fat, low-fat, or fat-free cheeses. Nonfat, low-sodium ricotta or cottage cheese. Low-fat or nonfat yogurt. Low-fat, low-sodium cheese. Fats and oils Soft margarine without trans fats. Vegetable oil. Reduced-fat, low-fat, or light mayonnaise and salad dressings (reduced-sodium). Canola, safflower, olive, avocado, soybean, and sunflower oils. Avocado. Seasonings and condiments Herbs. Spices. Seasoning mixes without salt. Other foods Unsalted popcorn and pretzels. Fat-free sweets. The items listed above may not be a complete list of foods and beverages you can eat. Contact a dietitian for more information. What foods should I avoid? Fruits Canned fruit in a light or heavy syrup. Fried fruit. Fruit in cream or butter sauce. Vegetables Creamed or  fried vegetables. Vegetables in a cheese sauce. Regular canned vegetables (not low-sodium or reduced-sodium). Regular canned tomato sauce and paste (not low-sodium or reduced-sodium). Regular tomato and vegetable juice (not low-sodium or reduced-sodium). Angie Fava. Olives. Grains Baked goods made with fat, such as croissants, muffins, or some breads. Dry pasta or rice meal packs. Meats and other proteins Fatty cuts of meat. Ribs. Fried meat. Berniece Salines. Bologna, salami, and other precooked or cured meats, such as sausages or meat loaves. Fat from the back of a pig (fatback). Bratwurst. Salted nuts and seeds. Canned beans with added salt. Canned or smoked fish. Whole eggs or egg yolks. Chicken or [place] with skin. Dairy Whole or 2% milk, cream, and half-and-half. Whole or full-fat cream cheese. Whole-fat or sweetened yogurt. Full-fat cheese. Nondairy creamers. Whipped toppings. Processed cheese and cheese spreads. Fats and oils Butter. Stick margarine. Lard. Shortening. Ghee. Bacon fat. Tropical oils, such as coconut, palm kernel, or palm oil. Seasonings and condiments Onion salt, garlic salt, seasoned salt, table salt, and sea salt. Worcestershire sauce. Tartar sauce. Barbecue sauce. Teriyaki sauce. Soy sauce, including reduced-sodium. Steak sauce. Canned and packaged gravies. Fish sauce. Oyster sauce.  Cocktail sauce. Store-bought horseradish. Ketchup. Mustard. Meat flavorings and tenderizers. Bouillon cubes. Hot sauces. Pre-made or packaged marinades. Pre-made or packaged taco seasonings. Relishes. Regular salad dressings. Other foods Salted popcorn and pretzels. The items listed above may not be a complete list of foods and beverages you should avoid. Contact a dietitian for more information. Where to find more information National Heart, Lung, and Blood Institute: https://wilson-eaton.com/ American Heart Association: www.heart.org Academy of Nutrition and Dietetics: www.eatright.[place]:  www.kidney.org Summary The DASH eating plan is a healthy eating plan that has been shown to reduce high blood pressure (hypertension). It may also reduce your risk for type 2 diabetes, heart disease, and stroke. When on the DASH eating plan, aim to eat more fresh fruits and vegetables, whole grains, lean proteins, low-fat dairy, and heart-healthy fats. With the DASH eating plan, you should limit salt (sodium) intake to 2,300 mg a day. If you have hypertension, you may need to reduce your sodium intake to 1,500 mg a day. Work with your health care provider or dietitian to adjust your eating plan to your individual calorie needs. This information is not intended to replace advice given to you by your health care provider. Make sure you discuss any questions you have with your health care provider. Document Revised: 07/09/2019 Document Reviewed: 07/09/2019 Elsevier Patient Education  [place].

## 2022-08-30 NOTE — Assessment & Plan Note (Signed)
Will check fasting lipid next week  Nutrition: Stressed importance of moderation in sodium intake, saturated fat and cholesterol, caloric balance, sufficient intake of complex carbohydrates, fiber, calcium and iron.   Exercise: Stressed the importance of regular exercise.

## 2022-08-30 NOTE — Assessment & Plan Note (Signed)
Continue using CPAP Exercise regularly and watch your diet to reduce the current weight

## 2022-08-30 NOTE — Assessment & Plan Note (Signed)
Suggested to cut off the drinking from 6 beers per day to 5 beers per day for a week then 4 beers per day for another week, then 3 beers per day to another week and see how he does with that. Suggested about joining [place] groups

## 2022-08-30 NOTE — Assessment & Plan Note (Signed)
Will check lipid on next visit (09/06/2022)  Nutrition: Stressed importance of moderation in sodium intake, saturated fat and cholesterol, caloric balance, sufficient intake of complex carbohydrates, fiber, calcium and iron.   Exercise: Stressed the importance of regular exercise.

## 2022-08-30 NOTE — Assessment & Plan Note (Signed)
Well controlled, no symptoms of GERD Continue Nexium

## 2022-08-30 NOTE — Assessment & Plan Note (Signed)
Taking Benicar-HCT daily CBC and CMP checked today  Nutrition: Stressed importance of moderation in sodium intake, saturated fat and cholesterol, caloric balance, sufficient intake of complex carbohydrates, fiber, calcium and iron.   Exercise: Stressed the importance of regular exercise.

## 2022-09-05 NOTE — Progress Notes (Signed)
Subjective:  Patient ID: Adam Short, male    DOB: 07/01/1986  Age: 37 y.o. MRN: [id number]  Chief Complaints:   For lipid profile and to discuss current medical diagnosis  History of Present illness:  Patient is here for lipid profile since he was not fasting last visit.  He also wants to discuss the results from the last week lab. He is prediabetic and his HBA1C is 5.8,  and his AST and ALT is little high but patients admit using beers almost everyday. He also admits using a lot of [place]. He mentioned he is trying his best to cut off the beers.   Current Outpatient Medications on File Prior to Visit  Medication Sig Dispense Refill   cetirizine (ZYRTEC) 10 MG chewable tablet Chew 10 mg by mouth daily.     EPINEPHrine 0.3 mg/0.3 mL IJ SOAJ injection Inject 0.3 mg into the muscle as needed for anaphylaxis.     esomeprazole (NEXIUM) 40 MG capsule Take 1 capsule (40 mg total) by mouth daily. 90 capsule 3   olmesartan-hydrochlorothiazide (BENICAR HCT) 40-12.5 MG tablet Take 1 tablet by mouth daily for hypertension 90 tablet 3   No current facility-administered medications on file prior to visit.   Past Medical History:  Diagnosis Date   Essential hypertension 5/57/3220   Metabolic syndrome 2/54/2706   Past Surgical History:  Procedure Laterality Date   testicular tube surgery     VASECTOMY      Family History  Problem Relation Age of Onset   Hypertension Mother    Cancer Father    Heart disease Maternal Grandfather    Social History   Socioeconomic History   Marital status: Married    Spouse name: Not on file   Number of children: Not on file   Years of education: Not on file   Highest education level: Not on file  Occupational History   Not on file  Tobacco Use   Smoking status: Never   Smokeless tobacco: Never  Substance and Sexual Activity   Alcohol use: Yes    Alcohol/week: 2.0 standard drinks of alcohol    Types: 2 Cans of beer per week   Drug use: Never    Sexual activity: Yes  Other Topics Concern   Not on file  Social History Narrative   Not on file   Social Determinants of Health   Financial Resource Strain: Not on file  Food Insecurity: Not on file  Transportation Needs: Not on file  Physical Activity: Not on file  Stress: Not on file  Social Connections: Not on file    Review of Systems  Constitutional:  Negative for chills, diaphoresis, fatigue and fever.  HENT:  Negative for congestion, ear pain and sore throat.   Respiratory:  Negative for cough and shortness of breath.   Cardiovascular:  Negative for chest pain and palpitations.  Gastrointestinal:  Negative for abdominal pain, constipation, diarrhea, nausea and vomiting.  Endocrine: Negative for polydipsia, polyphagia and polyuria.  Genitourinary:  Negative for dysuria and frequency.  Musculoskeletal:  Negative for arthralgias, back pain and myalgias.  Neurological:  Negative for dizziness and headaches.  Psychiatric/Behavioral:  Negative for dysphoric mood. The patient is not nervous/anxious.      Objective:  BP 118/88 (BP Location: Left Arm, Patient Position: Sitting, Cuff Size: Large)   Pulse 86   Temp (!) 97.3 F (36.3 C) (Temporal)   Ht 6' (1.829 m)   Wt 287 lb (130.2 kg)   SpO2 96%  BMI 38.92 kg/m      09/06/2022    7:33 AM 08/30/2022    8:44 AM 04/12/2022    3:54 PM  BP/Weight  Systolic BP 343 568 616  Diastolic BP 88 84 78  Wt. (Lbs) 287 290 278.6  BMI 38.92 kg/m2 39.33 kg/m2 37.78 kg/m2    Physical Exam Vitals reviewed.  Constitutional:      Appearance: Normal appearance.  HENT:     Right Ear: Tympanic membrane normal.     Left Ear: Tympanic membrane normal.     Nose: Nose normal.     Mouth/Throat:     Pharynx: No oropharyngeal exudate or posterior oropharyngeal erythema.  Eyes:     Conjunctiva/sclera: Conjunctivae normal.  Neck:     Vascular: No carotid bruit.  Cardiovascular:     Rate and Rhythm: Normal rate and regular rhythm.      Pulses: Normal pulses.     Heart sounds: Normal heart sounds.  Pulmonary:     Effort: Pulmonary effort is normal.     Breath sounds: Normal breath sounds.  Abdominal:     General: Bowel sounds are normal.     Palpations: There is no mass.     Tenderness: There is no abdominal tenderness.  Musculoskeletal:     Cervical back: Normal range of motion.  Skin:    Findings: No lesion.  Neurological:     Mental Status: He is alert and oriented to person, place, and time.  Psychiatric:        Mood and Affect: Mood normal.        Behavior: Behavior normal.      Lab Results  Component Value Date   WBC 6.0 08/30/2022   HGB 16.1 08/30/2022   HCT 46.7 08/30/2022   PLT 227 08/30/2022   GLUCOSE 95 08/30/2022   ALT 66 (H) 08/30/2022   AST 46 (H) 08/30/2022   NA 139 08/30/2022   K 4.5 08/30/2022   CL 102 08/30/2022   CREATININE 1.05 08/30/2022   BUN 11 08/30/2022   CO2 21 08/30/2022   TSH 2.040 04/30/2022   HGBA1C 5.8 (H) 08/30/2022      Assessment & Plan:  Mixed hyperlipidemia Assessment & Plan: Lipid profile drawn today  Nutrition: Stressed importance of moderation in sodium intake, saturated fat and cholesterol, caloric balance, sufficient intake of complex carbohydrates, fiber, calcium and iron.   Exercise: Stressed the importance of regular exercise.   Orders: -     Lipid panel  Excessive drinking of alcohol Assessment & Plan: Patient is cutting off his beer and said he dis not do any beer in past week Made him aware his lover enzymes high and the effect of alcohol on it   Suggested to cut off the drinking from 6 beers per day to 5 beers per day for a week then 4 beers per day for another week, then 3 beers per day to another week and see how he does with that. Suggested about joining AA groups     Prediabetes Assessment & Plan: Latest A1C 5.8  Made him aware about the cutting carbs and daily exercises Will do lifestyle management for the first 6 months and how  he does down the road.  Nutrition: Stressed importance of moderation in sodium intake, saturated fat and cholesterol, caloric balance, sufficient intake of complex carbohydrates, fiber, calcium and iron.   Exercise: Stressed the importance of regular exercise.        Follow-up:  will call you if  lipid profile is abnormal and start you on some statin I, Lonn Im have reviewed all documentation for this visit. The documentation on 09/06/22   for the exam, diagnosis, procedures, and orders are all accurate and complete.     An After Visit Summary was printed and given to the patient.  Lurline Del, DNP, FNP [facility] [phone]

## 2022-09-06 ENCOUNTER — Encounter: Payer: Self-pay | Admitting: Nurse Practitioner

## 2022-09-06 ENCOUNTER — Ambulatory Visit (INDEPENDENT_AMBULATORY_CARE_PROVIDER_SITE_OTHER): Payer: 59 | Admitting: Nurse Practitioner

## 2022-09-06 VITALS — BP 118/88 | HR 86 | Temp 97.3°F | Ht 72.0 in | Wt 287.0 lb

## 2022-09-06 DIAGNOSIS — E782 Mixed hyperlipidemia: Secondary | ICD-10-CM

## 2022-09-06 DIAGNOSIS — F1010 Alcohol abuse, uncomplicated: Secondary | ICD-10-CM

## 2022-09-06 DIAGNOSIS — R7303 Prediabetes: Secondary | ICD-10-CM | POA: Diagnosis not present

## 2022-09-06 NOTE — Assessment & Plan Note (Signed)
Lipid profile drawn today  Nutrition: Stressed importance of moderation in sodium intake, saturated fat and cholesterol, caloric balance, sufficient intake of complex carbohydrates, fiber, calcium and iron.   Exercise: Stressed the importance of regular exercise.

## 2022-09-06 NOTE — Patient Instructions (Signed)
Follow up in 6 months   Diabetes Mellitus Action Plan Following a diabetes action plan is a way for you to manage your diabetes (diabetes mellitus) symptoms. The plan is color-coded to help you understand what actions you need to take based on any symptoms you are having. If you have symptoms in the red zone, you need medical care right away. If you have symptoms in the yellow zone, you are having problems. If you have symptoms in the green zone, you are doing well. Learning about and understanding diabetes can take time. Follow the plan that you develop with your health care provider. Know the target range for your blood sugar (glucose) level, and review your treatment plan with your health care provider at each visit. The target range for my blood sugar level is __________________________ mg/dL. Red zone Get medical help right away if you have any of the following symptoms: A blood sugar test result that is below 54 mg/dL (3 mmol/L). A blood sugar test result that is at or above 240 mg/dL (13.3 mmol/L) for 2 days in a row. Confusion or trouble thinking clearly. Difficulty breathing. Sickness or a fever for 2 or more days that is not getting better. Moderate or large ketone levels in your urine. Feeling tired or having no energy. If you have any red zone symptoms, do not wait to see if the symptoms will go away. Get medical help right away. Call your local emergency services (911 in the [place] Do not drive yourself to the hospital. If you have severely low blood sugar (severe hypoglycemia) and you cannot eat or drink, you may need glucagon. Make sure a family member or close friend knows how to check your blood sugar and how to give you glucagon. You may need to be treated in a hospital for this condition. Yellow zone If you have any of the following symptoms, your diabetes is not under control and you may need to make some changes: A blood sugar test result that is at or above 240 mg/dL  (13.3 mmol/L) for 2 days in a row. Blood sugar test results that are below 70 mg/dL (3.9 mmol/L). Other symptoms of hypoglycemia, such as: Shaking or feeling light-headed. Confusion or irritability. Feeling hungry. Having a fast heartbeat. If you have any yellow zone symptoms: Treat your hypoglycemia by eating or drinking 15 grams of a rapid-acting carbohydrate. Follow the 15:15 rule: Take 15 grams of a rapid-acting carbohydrate, such as: 1 tube of glucose gel. 4 glucose pills. 4 oz (120 mL) of fruit juice. 4 oz (120 mL) of regular (not diet) soda. Check your blood sugar 15 minutes after you take the carbohydrate. If the repeat blood sugar test is still at or below 70 mg/dL (3.9 mmol/L), take 15 grams of a carbohydrate again. If your blood sugar does not increase above 70 mg/dL (3.9 mmol/L) after 3 tries, get medical help right away. After your blood sugar returns to normal, eat a meal or a snack within 1 hour. Keep taking your daily medicines as told by your health care provider. Check your blood sugar more often than you normally would. Write down your results. Call your health care provider if you have trouble keeping your blood sugar in your target range.  Green zone These signs mean you are doing well and you can continue what you are doing to manage your diabetes: Your blood sugar is within your personal target range. For most people, a blood sugar level before a meal (preprandial) should  be 80-130 mg/dL (4.4-7.2 mmol/L). You feel well, and you are able to do daily activities. If you are in the green zone, continue to manage your diabetes as told by your health care provider. To do this: Eat a healthy diet. Exercise regularly. Check your blood sugar as told by your health care provider. Take your medicines as told by your health care provider.  Where to find more information American Diabetes Association (ADA): diabetes.org Association of Diabetes Care & Education Specialists  (ADCES): diabeteseducator.org Summary Following a diabetes action plan is a way for you to manage your diabetes symptoms. The plan is color-coded to help you understand what actions you need to take based on any symptoms you are having. Follow the plan that you develop with your health care provider. Make sure you know your personal target blood sugar level. Review your treatment plan with your health care provider at each visit. This information is not intended to replace advice given to you by your health care provider. Make sure you discuss any questions you have with your health care provider. Document Revised: 02/10/2020 Document Reviewed: 02/10/2020 Elsevier Patient Education  [place].

## 2022-09-06 NOTE — Assessment & Plan Note (Signed)
Latest A1C 5.8  Made him aware about the cutting carbs and daily exercises Will do lifestyle management for the first 6 months and how he does down the road.  Nutrition: Stressed importance of moderation in sodium intake, saturated fat and cholesterol, caloric balance, sufficient intake of complex carbohydrates, fiber, calcium and iron.   Exercise: Stressed the importance of regular exercise.

## 2022-09-06 NOTE — Assessment & Plan Note (Signed)
Patient is cutting off his beer and said he dis not do any beer in past week Made him aware his lover enzymes high and the effect of alcohol on it   Suggested to cut off the drinking from 6 beers per day to 5 beers per day for a week then 4 beers per day for another week, then 3 beers per day to another week and see how he does with that. Suggested about joining [place] groups

## 2022-09-07 LAB — LIPID PANEL
Chol/HDL Ratio: 5 ratio (ref 0.0–5.0)
Cholesterol, Total: 169 mg/dL (ref 100–199)
HDL: 34 mg/dL — ABNORMAL LOW (ref >39)
LDL Chol Calc (NIH): 119 mg/dL — ABNORMAL HIGH (ref 0–99)
Triglycerides: 84 mg/dL (ref 0–149)
VLDL Cholesterol Cal: 16 mg/dL (ref 5–40)

## 2022-09-07 LAB — CARDIOVASCULAR RISK ASSESSMENT

## 2022-10-18 DIAGNOSIS — G4733 Obstructive sleep apnea (adult) (pediatric): Secondary | ICD-10-CM | POA: Diagnosis not present

## 2022-11-01 ENCOUNTER — Ambulatory Visit: Payer: 59 | Admitting: Cardiology

## 2022-11-18 ENCOUNTER — Other Ambulatory Visit (HOSPITAL_COMMUNITY): Payer: Self-pay

## 2022-11-18 ENCOUNTER — Other Ambulatory Visit (HOSPITAL_BASED_OUTPATIENT_CLINIC_OR_DEPARTMENT_OTHER): Payer: Self-pay

## 2022-11-18 ENCOUNTER — Other Ambulatory Visit: Payer: Self-pay

## 2022-11-18 MED ORDER — OLMESARTAN MEDOXOMIL-HCTZ 40-12.5 MG PO TABS
1.0000 | ORAL_TABLET | Freq: Every day | ORAL | 0 refills | Status: DC
  Filled 2022-11-18 – 2022-11-19 (×2): qty 30, 30d supply, fill #0

## 2022-11-19 ENCOUNTER — Other Ambulatory Visit (HOSPITAL_COMMUNITY): Payer: Self-pay

## 2022-11-19 ENCOUNTER — Other Ambulatory Visit (HOSPITAL_BASED_OUTPATIENT_CLINIC_OR_DEPARTMENT_OTHER): Payer: Self-pay

## 2022-11-20 ENCOUNTER — Other Ambulatory Visit (HOSPITAL_BASED_OUTPATIENT_CLINIC_OR_DEPARTMENT_OTHER): Payer: Self-pay

## 2022-11-22 ENCOUNTER — Ambulatory Visit: Payer: 59 | Admitting: Cardiology

## 2022-11-28 ENCOUNTER — Other Ambulatory Visit (HOSPITAL_BASED_OUTPATIENT_CLINIC_OR_DEPARTMENT_OTHER): Payer: Self-pay

## 2022-11-28 ENCOUNTER — Ambulatory Visit (INDEPENDENT_AMBULATORY_CARE_PROVIDER_SITE_OTHER): Payer: 59 | Admitting: Physician Assistant

## 2022-11-28 ENCOUNTER — Encounter: Payer: Self-pay | Admitting: Physician Assistant

## 2022-11-28 VITALS — BP 120/80 | HR 96 | Temp 97.4°F | Resp 12 | Ht 72.0 in | Wt 279.0 lb

## 2022-11-28 DIAGNOSIS — M79672 Pain in left foot: Secondary | ICD-10-CM

## 2022-11-28 MED ORDER — ESOMEPRAZOLE MAGNESIUM 40 MG PO CPDR
40.0000 mg | DELAYED_RELEASE_CAPSULE | Freq: Every day | ORAL | 1 refills | Status: DC
  Filled 2022-11-28 – 2023-02-13 (×3): qty 90, 90d supply, fill #0
  Filled 2023-05-14: qty 90, 90d supply, fill #1

## 2022-11-28 MED ORDER — MELOXICAM 15 MG PO TABS: 15.0000 mg | ORAL_TABLET | Freq: Every day | ORAL | 0 refills | Status: DC

## 2022-11-28 MED ORDER — OLMESARTAN MEDOXOMIL-HCTZ 40-12.5 MG PO TABS
1.0000 | ORAL_TABLET | Freq: Every day | ORAL | 1 refills | Status: DC
  Filled 2022-11-28 – 2022-12-23 (×2): qty 90, 90d supply, fill #0

## 2022-11-28 MED ORDER — PREDNISONE 20 MG PO TABS: 20.0000 mg | ORAL_TABLET | Freq: Two times a day (BID) | ORAL | 0 refills | Status: DC

## 2022-11-28 NOTE — Progress Notes (Signed)
Acute Office Visit  Subjective:    Patient ID: Adam Short, male    DOB: 1986/06/21, 37 y.o.   MRN: [id number]  Chief Complaint  Patient presents with   Foot Pain    Left foot for 4 weeks    HPI: Patient is in today for complaints of left heel pain.  He states that he has not had an injury or trauma.  Says that when he first gets up in morning or after sitting for long periods of time he has extreme pain in left heel.  No pain into achilles tendon - no pain down the mid to end of foot  Past Medical History:  Diagnosis Date   Essential hypertension 04/11/2022   Metabolic syndrome 04/11/2022    Past Surgical History:  Procedure Laterality Date   testicular tube surgery     VASECTOMY      Family History  Problem Relation Age of Onset   Hypertension Mother    Cancer Father    Heart disease Maternal Grandfather     Social History   Socioeconomic History   Marital status: Married    Spouse name: Not on file   Number of children: Not on file   Years of education: Not on file   Highest education level: Not on file  Occupational History   Not on file  Tobacco Use   Smoking status: Never   Smokeless tobacco: Never  Vaping Use   Vaping Use: Never used  Substance and Sexual Activity   Alcohol use: Yes    Alcohol/week: 4.0 standard drinks of alcohol    Types: 4 Cans of beer per week    Comment: weekends   Drug use: Never   Sexual activity: Yes    Partners: Female  Other Topics Concern   Not on file  Social History Narrative   Not on file   Social Determinants of Health   Financial Resource Strain: Not on file  Food Insecurity: Not on file  Transportation Needs: Not on file  Physical Activity: Not on file  Stress: Not on file  Social Connections: Not on file  Intimate Partner Violence: Not on file    Outpatient Medications Prior to Visit  Medication Sig Dispense Refill   cetirizine (ZYRTEC) 10 MG chewable tablet Chew 10 mg by mouth daily.      EPINEPHrine 0.3 mg/0.3 mL IJ SOAJ injection Inject 0.3 mg into the muscle as needed for anaphylaxis.     esomeprazole (NEXIUM) 40 MG capsule Take 1 capsule (40 mg total) by mouth daily. 90 capsule 3   olmesartan-hydrochlorothiazide (BENICAR HCT) 40-12.5 MG tablet Take 1 tablet by mouth daily. 30 tablet 0   No facility-administered medications prior to visit.    Allergies  Allergen Reactions   Bee Venom     Review of Systems CONSTITUTIONAL: Negative for chills, fatigue, fever,  MSK: see HPI INTEGUMENTARY: Negative for rash.       Objective:    PHYSICAL EXAM:   VS: BP 120/80   Pulse 96   Temp (!) 97.4 F (36.3 C)   Resp 12   Ht 6' (1.829 m)   Wt 279 lb (126.6 kg)   SpO2 96%   BMI 37.84 kg/m   GEN: Well nourished, well developed, in no acute distress  Cardiac: RRR; no murmurs, Respiratory:  normal respiratory rate and pattern with no distress - normal breath sounds with no rales, rhonchi, wheezes or rubs MS: tender to left heel to palpation Skin: warm and  dry, no rash    Health Maintenance Due  Topic Date Due   HIV Screening  Never done   Hepatitis C Screening  Never done   DTaP/Tdap/Td (1 - Tdap) Never done    There are no preventive care reminders to display for this patient.   Lab Results  Component Value Date   TSH 2.040 04/30/2022   Lab Results  Component Value Date   WBC 6.0 08/30/2022   HGB 16.1 08/30/2022   HCT 46.7 08/30/2022   MCV 88 08/30/2022   PLT 227 08/30/2022   Lab Results  Component Value Date   NA 139 08/30/2022   K 4.5 08/30/2022   CO2 21 08/30/2022   GLUCOSE 95 08/30/2022   BUN 11 08/30/2022   CREATININE 1.05 08/30/2022   BILITOT 0.3 08/30/2022   ALKPHOS 124 (H) 08/30/2022   AST 46 (H) 08/30/2022   ALT 66 (H) 08/30/2022   PROT 7.3 08/30/2022   ALBUMIN 4.6 08/30/2022   CALCIUM 9.1 08/30/2022   EGFR 93 08/30/2022   Lab Results  Component Value Date   CHOL 169 09/06/2022   Lab Results  Component Value Date   HDL 34  (L) 09/06/2022   Lab Results  Component Value Date   LDLCALC 119 (H) 09/06/2022   Lab Results  Component Value Date   TRIG 84 09/06/2022   Lab Results  Component Value Date   CHOLHDL 5.0 09/06/2022   Lab Results  Component Value Date   HGBA1C 5.8 (H) 08/30/2022       Assessment & Plan:  Pain of left heel -     predniSONE; Take 1 tablet (20 mg total) by mouth 2 (two) times daily with a meal.  Dispense: 10 tablet; Refill: 0 -     Meloxicam; Take 1 tablet (15 mg total) by mouth daily.  Dispense: 30 tablet; Refill: 0   Recommend heel stretches, ice therapy and heel cup  Meds ordered this encounter  Medications   predniSONE (DELTASONE) 20 MG tablet    Sig: Take 1 tablet (20 mg total) by mouth 2 (two) times daily with a meal.    Dispense:  10 tablet    Refill:  0    Order Specific Question:   Supervising Provider    Answer:   Adam Short   meloxicam (MOBIC) 15 MG tablet    Sig: Take 1 tablet (15 mg total) by mouth daily.    Dispense:  30 tablet    Refill:  0    Order Specific Question:   Supervising Provider    Answer:   Adam Short    No orders of the defined types were placed in this encounter.    Follow-up: Return for follow up fasting chronic with Adam Short in July(if not scheduled yet).  An After Visit Summary was printed and given to the patient.  [facility] [phone]

## 2022-12-23 ENCOUNTER — Other Ambulatory Visit (HOSPITAL_COMMUNITY): Payer: Self-pay

## 2022-12-23 ENCOUNTER — Other Ambulatory Visit: Payer: Self-pay

## 2022-12-25 ENCOUNTER — Other Ambulatory Visit: Payer: Self-pay | Admitting: Physician Assistant

## 2022-12-25 DIAGNOSIS — M79672 Pain in left foot: Secondary | ICD-10-CM

## 2023-01-22 DIAGNOSIS — G4733 Obstructive sleep apnea (adult) (pediatric): Secondary | ICD-10-CM | POA: Diagnosis not present

## 2023-01-29 ENCOUNTER — Encounter: Payer: Self-pay | Admitting: Physician Assistant

## 2023-01-29 ENCOUNTER — Ambulatory Visit (INDEPENDENT_AMBULATORY_CARE_PROVIDER_SITE_OTHER): Payer: 59 | Admitting: Physician Assistant

## 2023-01-29 VITALS — BP 134/92 | HR 72 | Temp 97.3°F | Ht 72.0 in | Wt 282.8 lb

## 2023-01-29 DIAGNOSIS — I10 Essential (primary) hypertension: Secondary | ICD-10-CM

## 2023-01-29 DIAGNOSIS — E782 Mixed hyperlipidemia: Secondary | ICD-10-CM

## 2023-01-29 DIAGNOSIS — K219 Gastro-esophageal reflux disease without esophagitis: Secondary | ICD-10-CM

## 2023-01-29 DIAGNOSIS — R7303 Prediabetes: Secondary | ICD-10-CM

## 2023-01-29 MED ORDER — OLMESARTAN MEDOXOMIL-HCTZ 40-25 MG PO TABS
1.0000 | ORAL_TABLET | Freq: Every day | ORAL | 1 refills | Status: DC
  Filled 2023-01-29 – 2023-02-13 (×3): qty 90, 90d supply, fill #0
  Filled 2023-05-09: qty 90, 90d supply, fill #1

## 2023-01-29 NOTE — Progress Notes (Signed)
Subjective:  Patient ID: Adam Short, male    DOB: Apr 08, 1986  Age: 37 y.o. MRN: [id number]  Chief Complaint  Patient presents with   Medical Management of Chronic Issues    HPI Pt presents for follow up of hypertension. The patient is tolerating the medication well without side effects. Compliance with treatment has been good; including taking medication as directed , maintains a healthy diet and regular exercise regimen , and following up as directed. Blood pressure elevated today - pt does not check at home.  Currently on benicar 40/12.5mg  qd  Pt with history of GERD - stable on nexium 40mg  qd  Pt with history of prediabetes - due for labwork       01/29/2023    3:59 PM 08/30/2022    8:45 AM  Depression screen PHQ 2/9  Decreased Interest 0 0  Down, Depressed, Hopeless 0 0  PHQ - 2 Score 0 0        08/30/2022    8:44 AM 09/06/2022    7:34 AM 01/29/2023    3:58 PM  Fall Risk  Falls in the past year?  0 0  Was there an injury with Fall? 0 0 0  Fall Risk Category Calculator  0 0  (RETIRED) Patient Fall Risk Level Low fall risk    Patient at Risk for Falls Due to No Fall Risks  No Fall Risks  Fall risk Follow up Falls evaluation completed Falls evaluation completed Falls evaluation completed     ROS CONSTITUTIONAL: Negative for chills, fatigue, fever, E/N/T: Negative for ear pain, nasal congestion and sore throat.  CARDIOVASCULAR: Negative for chest pain, dizziness, palpitations and pedal edema.  RESPIRATORY: Negative for recent cough and dyspnea.  GASTROINTESTINAL: Negative for abdominal pain, acid reflux symptoms, constipation, diarrhea, nausea and vomiting.    Current Outpatient Medications:    cetirizine (ZYRTEC) 10 MG chewable tablet, Chew 10 mg by mouth daily., Disp: , Rfl:    EPINEPHrine 0.3 mg/0.3 mL IJ SOAJ injection, Inject 0.3 mg into the muscle as needed for anaphylaxis., Disp: , Rfl:    esomeprazole (NEXIUM) 40 MG capsule, Take 1 capsule (40 mg total) by  mouth daily., Disp: 90 capsule, Rfl: 1   meloxicam (MOBIC) 15 MG tablet, TAKE 1 TABLET(15 MG) BY MOUTH DAILY, Disp: 30 tablet, Rfl: 2   olmesartan-hydrochlorothiazide (BENICAR HCT) 40-25 MG tablet, Take 1 tablet by mouth daily., Disp: 90 tablet, Rfl: 1  Past Medical History:  Diagnosis Date   Essential hypertension 04/11/2022   Metabolic syndrome 04/11/2022   Objective:  PHYSICAL EXAM:   BP (!) 134/92 (BP Location: Left Arm, Patient Position: Sitting, Cuff Size: Large)   Pulse 72   Temp (!) 97.3 F (36.3 C) (Temporal)   Ht 6' (1.829 m)   Wt 282 lb 12.8 oz (128.3 kg)   SpO2 96%   BMI 38.35 kg/m    GEN: Well nourished, well developed, in no acute distress   Cardiac: RRR; no murmurs, rubs, or gallops,no edema - Respiratory:  normal respiratory rate and pattern with no distress - normal breath sounds with no rales, rhonchi, wheezes or rubs  Skin: warm and dry, no rash   Psych: euthymic mood, appropriate affect and demeanor  Assessment & Plan:    Essential hypertension -     Olmesartan Medoxomil-HCTZ; Take 1 tablet by mouth daily.  Dispense: 90 tablet; Refill: 1- increased to 40/25mg  qd -     CBC with Differential/Platelet; Future -     Comprehensive metabolic  panel; Future -     TSH; Future  Mixed hyperlipidemia -     Lipid panel; Future  Prediabetes -     Hemoglobin A1c; Future  Gastroesophageal reflux disease without esophagitis Continue med    Follow-up: Return in about 6 months (around 07/31/2023) for chronic fasting follow-up- next week for labs and bp check.  An After Visit Summary was printed and given to the patient.  Jettie Pagan [facility] [phone]

## 2023-01-30 ENCOUNTER — Other Ambulatory Visit: Payer: Self-pay | Admitting: Physician Assistant

## 2023-01-30 ENCOUNTER — Encounter: Payer: Self-pay | Admitting: Physician Assistant

## 2023-01-30 ENCOUNTER — Other Ambulatory Visit (HOSPITAL_BASED_OUTPATIENT_CLINIC_OR_DEPARTMENT_OTHER): Payer: Self-pay

## 2023-01-30 ENCOUNTER — Other Ambulatory Visit (HOSPITAL_COMMUNITY): Payer: Self-pay

## 2023-01-30 DIAGNOSIS — E661 Drug-induced obesity: Secondary | ICD-10-CM

## 2023-01-30 MED ORDER — WEGOVY 0.25 MG/0.5ML ~~LOC~~ SOAJ
0.2500 mg | SUBCUTANEOUS | 0 refills | Status: DC
Start: 2023-01-30 — End: 2023-03-07
  Filled 2023-01-30: qty 2, 28d supply, fill #0

## 2023-01-30 MED ORDER — TOPIRAMATE 25 MG PO TABS
25.0000 mg | ORAL_TABLET | Freq: Every day | ORAL | 1 refills | Status: DC
  Filled 2023-01-30: qty 30, 30d supply, fill #0
  Filled 2023-02-27: qty 30, 30d supply, fill #1

## 2023-01-30 NOTE — Telephone Encounter (Signed)
Called patient made him aware that [facility] insurance stop covering the weight loss injection, and he stated he is okay with trying the phentermine

## 2023-02-05 ENCOUNTER — Other Ambulatory Visit: Payer: 59

## 2023-02-05 DIAGNOSIS — I10 Essential (primary) hypertension: Secondary | ICD-10-CM | POA: Diagnosis not present

## 2023-02-05 DIAGNOSIS — E782 Mixed hyperlipidemia: Secondary | ICD-10-CM | POA: Diagnosis not present

## 2023-02-05 DIAGNOSIS — R7303 Prediabetes: Secondary | ICD-10-CM

## 2023-02-05 NOTE — Progress Notes (Signed)
Patient came in today for lab work and wanted to get his BP check, BP was 124/86 and pulse 66.  Patient stated he not having any problems and he is still taking the Olmesartan-hydrochlorothiazide 40-12.5 mg every day. He has not started the 40-25 mg dose.

## 2023-02-06 LAB — COMPREHENSIVE METABOLIC PANEL
ALT: 41 IU/L (ref 0–44)
AST: 31 IU/L (ref 0–40)
Albumin: 4.3 g/dL (ref 4.1–5.1)
Alkaline Phosphatase: 112 IU/L (ref 44–121)
BUN/Creatinine Ratio: 21 — ABNORMAL HIGH (ref 9–20)
BUN: 22 mg/dL — ABNORMAL HIGH (ref 6–20)
Bilirubin Total: 0.5 mg/dL (ref 0.0–1.2)
CO2: 23 mmol/L (ref 20–29)
Calcium: 9.4 mg/dL (ref 8.7–10.2)
Chloride: 101 mmol/L (ref 96–106)
Creatinine, Ser: 1.04 mg/dL (ref 0.76–1.27)
Globulin, Total: 2.8 g/dL (ref 1.5–4.5)
Glucose: 102 mg/dL — ABNORMAL HIGH (ref 70–99)
Potassium: 4.3 mmol/L (ref 3.5–5.2)
Sodium: 138 mmol/L (ref 134–144)
Total Protein: 7.1 g/dL (ref 6.0–8.5)
eGFR: 94 mL/min/{1.73_m2} (ref >59)

## 2023-02-06 LAB — CBC WITH DIFFERENTIAL/PLATELET
Basophils Absolute: 0 10*3/uL (ref 0.0–0.2)
Basos: 1 %
EOS (ABSOLUTE): 0.2 10*3/uL (ref 0.0–0.4)
Eos: 3 %
Hematocrit: 44.1 % (ref 37.5–51.0)
Hemoglobin: 15.1 g/dL (ref 13.0–17.7)
Immature Grans (Abs): 0 10*3/uL (ref 0.0–0.1)
Immature Granulocytes: 0 %
Lymphocytes Absolute: 1.7 10*3/uL (ref 0.7–3.1)
Lymphs: 32 %
MCH: 30.3 pg (ref 26.6–33.0)
MCHC: 34.2 g/dL (ref 31.5–35.7)
MCV: 88 fL (ref 79–97)
Monocytes Absolute: 0.7 10*3/uL (ref 0.1–0.9)
Monocytes: 13 %
Neutrophils Absolute: 2.7 10*3/uL (ref 1.4–7.0)
Neutrophils: 51 %
Platelets: 235 10*3/uL (ref 150–450)
RBC: 4.99 x10E6/uL (ref 4.14–5.80)
RDW: 13.1 % (ref 11.6–15.4)
WBC: 5.2 10*3/uL (ref 3.4–10.8)

## 2023-02-06 LAB — HEMOGLOBIN A1C
Est. average glucose Bld gHb Est-mCnc: 117 mg/dL
Hgb A1c MFr Bld: 5.7 % — ABNORMAL HIGH (ref 4.8–5.6)

## 2023-02-06 LAB — LIPID PANEL
Chol/HDL Ratio: 5.4 ratio — ABNORMAL HIGH (ref 0.0–5.0)
Cholesterol, Total: 195 mg/dL (ref 100–199)
HDL: 36 mg/dL — ABNORMAL LOW (ref >39)
LDL Chol Calc (NIH): 126 mg/dL — ABNORMAL HIGH (ref 0–99)
Triglycerides: 187 mg/dL — ABNORMAL HIGH (ref 0–149)
VLDL Cholesterol Cal: 33 mg/dL (ref 5–40)

## 2023-02-06 LAB — TSH: TSH: 2.68 u[IU]/mL (ref 0.450–4.500)

## 2023-02-07 ENCOUNTER — Other Ambulatory Visit (HOSPITAL_COMMUNITY): Payer: Self-pay

## 2023-02-13 ENCOUNTER — Other Ambulatory Visit: Payer: Self-pay

## 2023-02-28 ENCOUNTER — Other Ambulatory Visit (HOSPITAL_COMMUNITY): Payer: Self-pay

## 2023-03-07 ENCOUNTER — Ambulatory Visit (INDEPENDENT_AMBULATORY_CARE_PROVIDER_SITE_OTHER): Payer: 59 | Admitting: Physician Assistant

## 2023-03-07 ENCOUNTER — Other Ambulatory Visit (HOSPITAL_COMMUNITY): Payer: Self-pay

## 2023-03-07 ENCOUNTER — Encounter: Payer: Self-pay | Admitting: Physician Assistant

## 2023-03-07 ENCOUNTER — Other Ambulatory Visit: Payer: Self-pay

## 2023-03-07 VITALS — BP 124/78 | HR 89 | Temp 97.1°F | Ht 72.0 in | Wt 280.0 lb

## 2023-03-07 DIAGNOSIS — M79672 Pain in left foot: Secondary | ICD-10-CM | POA: Diagnosis not present

## 2023-03-07 DIAGNOSIS — I10 Essential (primary) hypertension: Secondary | ICD-10-CM | POA: Diagnosis not present

## 2023-03-07 MED ORDER — MELOXICAM 15 MG PO TABS
15.0000 mg | ORAL_TABLET | Freq: Every day | ORAL | 1 refills | Status: AC
Start: 2023-03-07 — End: ?
  Filled 2023-03-07: qty 90, 90d supply, fill #0
  Filled 2023-08-05 – 2023-10-27 (×2): qty 90, 90d supply, fill #1

## 2023-03-07 MED ORDER — MELOXICAM 15 MG PO TABS
15.0000 mg | ORAL_TABLET | Freq: Every day | ORAL | 1 refills | Status: DC
Start: 2023-03-07 — End: 2023-03-07
  Filled 2023-03-07: qty 90, 90d supply, fill #0

## 2023-03-07 NOTE — Progress Notes (Signed)
Subjective:  Patient ID: Adam Short, male    DOB: [birth year]  Age: 37 y.o. MRN: [id number]  Chief Complaint  Patient presents with   Medical Management of Chronic Issues    Hypertension    HPI Pt in today for follow up of hypertension.  Readings better since changing medication to benicar HCT 40/25mg   Denies chest pain/dyspnea/edema  Pt requests refill of meloxicam for heel pain - he states it has been doing better but ran out of medication     03/07/2023    8:39 AM 01/29/2023    3:59 PM 08/30/2022    8:45 AM  Depression screen PHQ 2/9  Decreased Interest 0 0 0  Down, Depressed, Hopeless 0 0 0  PHQ - 2 Score 0 0 0        08/30/2022    8:44 AM 09/06/2022    7:34 AM 01/29/2023    3:58 PM 03/07/2023    8:39 AM  Fall Risk  Falls in the past year?  0 0   Was there an injury with Fall? 0 0 0 0  Fall Risk Category Calculator  0 0   (RETIRED) Patient Fall Risk Level Low fall risk     Patient at Risk for Falls Due to No Fall Risks  No Fall Risks No Fall Risks  Fall risk Follow up Falls evaluation completed Falls evaluation completed Falls evaluation completed Falls evaluation completed     ROS CONSTITUTIONAL: Negative for chills, fatigue, fever,  CARDIOVASCULAR: Negative for chest pain, dizziness, palpitations and pedal edema.  RESPIRATORY: Negative for recent cough and dyspnea.  GASTROINTESTINAL: Negative for abdominal pain, acid reflux symptoms, constipation, diarrhea, nausea and vomiting.  MSK: see HPI    Current Outpatient Medications:    cetirizine (ZYRTEC) 10 MG chewable tablet, Chew 10 mg by mouth daily., Disp: , Rfl:    EPINEPHrine 0.3 mg/0.3 mL IJ SOAJ injection, Inject 0.3 mg into the muscle as needed for anaphylaxis., Disp: , Rfl:    esomeprazole (NEXIUM) 40 MG capsule, Take 1 capsule (40 mg total) by mouth daily., Disp: 90 capsule, Rfl: 1   meloxicam (MOBIC) 15 MG tablet, Take 1 tablet (15 mg total) by mouth daily., Disp: 90 tablet, Rfl: 1    olmesartan-hydrochlorothiazide (BENICAR HCT) 40-25 MG tablet, Take 1 tablet by mouth daily., Disp: 90 tablet, Rfl: 1   topiramate (TOPAMAX) 25 MG tablet, Take 1 tablet (25 mg total) by mouth daily., Disp: 30 tablet, Rfl: 1  Past Medical History:  Diagnosis Date   Essential hypertension 04/11/2022   Metabolic syndrome 04/11/2022   Objective:  PHYSICAL EXAM:   BP 124/78   Pulse 89   Temp (!) 97.1 F (36.2 C)   Ht 6' (1.829 m)   Wt 280 lb (127 kg)   SpO2 98%   BMI 37.97 kg/m    GEN: Well nourished, well developed, in no acute distress  Cardiac: RRR; no murmurs, rubs, or gallops,no edema -  Respiratory:  normal respiratory rate and pattern with no distress - normal breath sounds with no rales, rhonchi, wheezes or rubs  Psych: euthymic mood, appropriate affect and demeanor  Assessment & Plan:    Essential hypertension Continue med Pain of left heel -     Meloxicam; Take 1 tablet (15 mg total) by mouth daily.  Dispense: 90 tablet; Refill: 1     Follow-up: Return in about 6 months (around 09/07/2023) for chronic fasting follow-up.  An After Visit Summary was printed and given to the patient.  Jettie Pagan [facility] [phone]

## 2023-03-13 ENCOUNTER — Ambulatory Visit: Payer: 59 | Admitting: Physician Assistant

## 2023-03-14 ENCOUNTER — Ambulatory Visit: Payer: 59 | Admitting: Physician Assistant

## 2023-03-28 ENCOUNTER — Other Ambulatory Visit: Payer: Self-pay | Admitting: Physician Assistant

## 2023-03-28 DIAGNOSIS — E661 Drug-induced obesity: Secondary | ICD-10-CM

## 2023-03-29 ENCOUNTER — Other Ambulatory Visit (HOSPITAL_COMMUNITY): Payer: Self-pay

## 2023-03-31 MED ORDER — TOPIRAMATE 25 MG PO TABS
25.0000 mg | ORAL_TABLET | Freq: Every day | ORAL | 1 refills | Status: DC
Start: 2023-03-31 — End: 2023-09-01
  Filled 2023-03-31: qty 30, 30d supply, fill #0
  Filled 2023-05-06: qty 30, 30d supply, fill #1

## 2023-04-01 ENCOUNTER — Other Ambulatory Visit (HOSPITAL_COMMUNITY): Payer: Self-pay

## 2023-04-01 ENCOUNTER — Other Ambulatory Visit: Payer: Self-pay

## 2023-04-22 ENCOUNTER — Ambulatory Visit (INDEPENDENT_AMBULATORY_CARE_PROVIDER_SITE_OTHER): Payer: 59 | Admitting: Physician Assistant

## 2023-04-22 ENCOUNTER — Encounter: Payer: Self-pay | Admitting: Physician Assistant

## 2023-04-22 VITALS — BP 118/78 | HR 95 | Temp 97.3°F | Ht 72.0 in | Wt 291.8 lb

## 2023-04-22 DIAGNOSIS — J060 Acute laryngopharyngitis: Secondary | ICD-10-CM

## 2023-04-22 LAB — POC COVID19 BINAXNOW: SARS Coronavirus 2 Ag: NEGATIVE

## 2023-04-22 MED ORDER — TRIAMCINOLONE ACETONIDE 40 MG/ML IJ SUSP
60.0000 mg | Freq: Once | INTRAMUSCULAR | Status: AC
Start: 2023-04-22 — End: 2023-04-22
  Administered 2023-04-22: 60 mg via INTRAMUSCULAR

## 2023-04-22 MED ORDER — HYDROCODONE BIT-HOMATROP MBR 5-1.5 MG/5ML PO SOLN
5.0000 mL | Freq: Four times a day (QID) | ORAL | 0 refills | Status: DC | PRN
Start: 2023-04-22 — End: 2023-08-17

## 2023-04-22 MED ORDER — AMOXICILLIN-POT CLAVULANATE 875-125 MG PO TABS: 1.0000 | ORAL_TABLET | Freq: Two times a day (BID) | ORAL | 0 refills | Status: DC

## 2023-04-22 NOTE — Progress Notes (Signed)
   Acute Office Visit  Subjective:    Patient ID: Adam Short, male    DOB: [birth year], 37 y.o.   MRN: [id number]  Chief Complaint  Patient presents with   Cough   Nasal Congestion   Headache    HPI: Patient is in today for complaints of cough, cold and congestion for the past few weeks.  Thinks he had COVID a few weeks ago but symptoms have persisted.  Feels like ears are popping, sinus pressure, sore throat and intermittent productive cough.  No further fevers   Current Outpatient Medications:    amoxicillin-clavulanate (AUGMENTIN) 875-125 MG tablet, Take 1 tablet by mouth 2 (two) times daily., Disp: 20 tablet, Rfl: 0   cetirizine (ZYRTEC) 10 MG chewable tablet, Chew 10 mg by mouth daily., Disp: , Rfl:    EPINEPHrine 0.3 mg/0.3 mL IJ SOAJ injection, Inject 0.3 mg into the muscle as needed for anaphylaxis., Disp: , Rfl:    esomeprazole (NEXIUM) 40 MG capsule, Take 1 capsule (40 mg total) by mouth daily., Disp: 90 capsule, Rfl: 1   HYDROcodone bit-homatropine (HYDROMET) 5-1.5 MG/5ML syrup, Take 5 mLs by mouth every 6 (six) hours as needed., Disp: 120 mL, Rfl: 0   meloxicam (MOBIC) 15 MG tablet, Take 1 tablet (15 mg total) by mouth daily., Disp: 90 tablet, Rfl: 1   olmesartan-hydrochlorothiazide (BENICAR HCT) 40-25 MG tablet, Take 1 tablet by mouth daily., Disp: 90 tablet, Rfl: 1   topiramate (TOPAMAX) 25 MG tablet, Take 1 tablet (25 mg total) by mouth daily., Disp: 30 tablet, Rfl: 1  Current Facility-Administered Medications:    triamcinolone acetonide (KENALOG-40) injection 60 mg, 60 mg, Intramuscular, Once,   Allergies  Allergen Reactions   Bee Venom     ROS CONSTITUTIONAL:see HPI E/N/T: see HPI CARDIOVASCULAR: Negative for chest pain,  RESPIRATORY: see HPI GASTROINTESTINAL: Negative for abdominal pain, acid reflux symptoms, constipation, diarrhea, nausea and vomiting.       Objective:    PHYSICAL EXAM:   BP 118/78 (BP Location: Left Arm, Patient Position:  Sitting, Cuff Size: Large)   Pulse 95   Temp (!) 97.3 F (36.3 C) (Temporal)   Ht 6' (1.829 m)   Wt 291 lb 12.8 oz (132.4 kg)   SpO2 96%   BMI 39.58 kg/m    GEN: Well nourished, well developed, in no acute distress  HEENT: normal external ears and nose - normal external auditory canals and TMS - hearing grossly normal -  - Lips, Teeth and Gums - normal  Oropharynx -erythema/pnd  Cardiac: RRR; no murmurs, rubs, Respiratory:  scattered rhonchi - clears with cough Psych: euthymic mood, appropriate affect and demeanor     Office Visit on 04/22/2023  Component Date Value Ref Range Status   SARS Coronavirus 2 Ag 04/22/2023 Negative  Negative Final    Assessment & Plan:    Acute laryngopharyngitis -     POC COVID-19 BinaxNow -     Triamcinolone Acetonide -     Amoxicillin-Pot Clavulanate; Take 1 tablet by mouth 2 (two) times daily.  Dispense: 20 tablet; Refill: 0 -     HYDROcodone Bit-Homatrop MBr; Take 5 mLs by mouth every 6 (six) hours as needed.  Dispense: 120 mL; Refill: 0     Follow-up: Return if symptoms worsen or fail to improve.  An After Visit Summary was printed and given to the patient.  Jettie Pagan [facility] [phone]

## 2023-04-25 DIAGNOSIS — G4733 Obstructive sleep apnea (adult) (pediatric): Secondary | ICD-10-CM | POA: Diagnosis not present

## 2023-05-06 ENCOUNTER — Other Ambulatory Visit (HOSPITAL_COMMUNITY): Payer: Self-pay

## 2023-05-09 ENCOUNTER — Other Ambulatory Visit (HOSPITAL_COMMUNITY): Payer: Self-pay

## 2023-05-13 ENCOUNTER — Other Ambulatory Visit: Payer: Self-pay | Admitting: Physician Assistant

## 2023-05-13 ENCOUNTER — Other Ambulatory Visit (HOSPITAL_BASED_OUTPATIENT_CLINIC_OR_DEPARTMENT_OTHER): Payer: Self-pay

## 2023-05-13 ENCOUNTER — Telehealth: Payer: Self-pay

## 2023-05-13 MED ORDER — PROCTOFOAM HC 1-1 % EX FOAM
1.0000 | Freq: Two times a day (BID) | CUTANEOUS | 2 refills | Status: AC
  Filled 2023-05-13: qty 10, 19d supply, fill #0
  Filled 2023-05-29: qty 10, 19d supply, fill #1
  Filled 2023-09-28 – 2023-09-29 (×2): qty 10, 19d supply, fill #2
  Filled 2023-09-29: qty 10, 19d supply, fill #0

## 2023-05-13 NOTE — Telephone Encounter (Signed)
Patient wife called and stated that he has been dealing with hemorrhoids for 2 weeks, he has tried OTC counter medication but nothing working, wanted to know if something can be called in for him. Please advise.

## 2023-05-13 NOTE — Telephone Encounter (Signed)
Yes will send in med

## 2023-05-13 NOTE — Telephone Encounter (Signed)
Wife made aware

## 2023-05-14 ENCOUNTER — Other Ambulatory Visit (HOSPITAL_BASED_OUTPATIENT_CLINIC_OR_DEPARTMENT_OTHER): Payer: Self-pay

## 2023-05-14 ENCOUNTER — Other Ambulatory Visit (HOSPITAL_COMMUNITY): Payer: Self-pay

## 2023-05-29 ENCOUNTER — Other Ambulatory Visit: Payer: Self-pay

## 2023-07-26 DIAGNOSIS — G4733 Obstructive sleep apnea (adult) (pediatric): Secondary | ICD-10-CM | POA: Diagnosis not present

## 2023-08-05 ENCOUNTER — Other Ambulatory Visit (HOSPITAL_COMMUNITY): Payer: Self-pay

## 2023-08-05 ENCOUNTER — Other Ambulatory Visit: Payer: Self-pay | Admitting: Physician Assistant

## 2023-08-05 ENCOUNTER — Other Ambulatory Visit: Payer: Self-pay

## 2023-08-05 DIAGNOSIS — I10 Essential (primary) hypertension: Secondary | ICD-10-CM

## 2023-08-05 MED ORDER — OLMESARTAN MEDOXOMIL-HCTZ 40-25 MG PO TABS
1.0000 | ORAL_TABLET | Freq: Every day | ORAL | 0 refills | Status: DC
Start: 2023-08-05 — End: 2023-11-06
  Filled 2023-08-05: qty 90, 90d supply, fill #0

## 2023-08-06 ENCOUNTER — Other Ambulatory Visit: Payer: Self-pay

## 2023-08-10 ENCOUNTER — Other Ambulatory Visit: Payer: Self-pay | Admitting: Physician Assistant

## 2023-08-10 MED ORDER — ESOMEPRAZOLE MAGNESIUM 40 MG PO CPDR
40.0000 mg | DELAYED_RELEASE_CAPSULE | Freq: Every day | ORAL | 1 refills | Status: DC
  Filled 2023-08-10 – 2023-11-11 (×2): qty 90, 90d supply, fill #0

## 2023-08-11 ENCOUNTER — Other Ambulatory Visit (HOSPITAL_COMMUNITY): Payer: Self-pay

## 2023-08-11 ENCOUNTER — Other Ambulatory Visit: Payer: Self-pay

## 2023-08-17 ENCOUNTER — Telehealth: Payer: 59 | Admitting: Family

## 2023-08-17 DIAGNOSIS — J209 Acute bronchitis, unspecified: Secondary | ICD-10-CM

## 2023-08-17 MED ORDER — BENZONATATE 100 MG PO CAPS: 100.0000 mg | ORAL_CAPSULE | Freq: Three times a day (TID) | ORAL | 0 refills | Status: DC | PRN

## 2023-08-17 MED ORDER — PREDNISONE 10 MG (21) PO TBPK
ORAL_TABLET | ORAL | 0 refills | Status: DC
Start: 2023-08-17 — End: 2023-09-01

## 2023-08-17 NOTE — Progress Notes (Signed)
Virtual Visit Consent   Adam Short, you are scheduled for a virtual visit with a [facility] provider today. Just as with appointments in the office, your consent must be obtained to participate. Your consent will be active for this visit and any virtual visit you may have with one of our providers in the next 365 days. If you have a MyChart account, a copy of this consent can be sent to you electronically.  As this is a virtual visit, video technology does not allow for your provider to perform a traditional examination. This may limit your provider's ability to fully assess your condition. If your provider identifies any concerns that need to be evaluated in person or the need to arrange testing (such as labs, EKG, etc.), we will make arrangements to do so. Although advances in technology are sophisticated, we cannot ensure that it will always work on either your end or our end. If the connection with a video visit is poor, the visit may have to be switched to a telephone visit. With either a video or telephone visit, we are not always able to ensure that we have a secure connection.  By engaging in this virtual visit, you consent to the provision of healthcare and authorize for your insurance to be billed (if applicable) for the services provided during this visit. Depending on your insurance coverage, you may receive a charge related to this service.  I need to obtain your verbal consent now. Are you willing to proceed with your visit today? Tyrike Greulich has provided verbal consent on 08/17/2023 for a virtual visit (video or telephone). Jannifer Rodney, FNP  Date: 08/17/2023 6:05 PM  Virtual Visit via Video Note   I, Jannifer Rodney, connected with  Adam Short  ([id number], February 27, 1986) on 08/17/23 at  6:15 PM EST by a video-enabled telemedicine application and verified that I am speaking with the correct person using two identifiers.  Location: Patient: Virtual Visit Location Patient:  Home Provider: Virtual Visit Location Provider: Home Office   I discussed the limitations of evaluation and management by telemedicine and the availability of in person appointments. The patient expressed understanding and agreed to proceed.    History of Present Illness: Adam Short is a 37 y.o. who identifies as a male who was assigned male at birth, and is being seen today for cough and congestion for two weeks.  HPI: Cough This is a new problem. The current episode started 1 to 4 weeks ago. The problem has been waxing and waning. The cough is Productive of sputum. Associated symptoms include a fever (a week ago, but improved), headaches, nasal congestion, shortness of breath and wheezing. Pertinent negatives include no chills, ear congestion, ear pain, myalgias or sore throat. He has tried rest and OTC cough suppressant for the symptoms. The treatment provided mild relief.    Problems:  Patient Active Problem List   Diagnosis Date Noted   Pain of left heel 11/28/2022   Prediabetes 09/06/2022   Encounter for physical examination 08/30/2022   Class 2 severe obesity due to excess calories with serious comorbidity and body mass index (BMI) of 39.0 to 39.9 in adult (HCC) 08/30/2022   Excessive drinking of alcohol 08/30/2022   Obstructive sleep apnea 04/12/2022   Essential hypertension 04/11/2022   GERD (gastroesophageal reflux disease) 04/11/2022   Hyperlipidemia 04/11/2022    Allergies:  Allergies  Allergen Reactions   Bee Venom    Medications:  Current Outpatient Medications:    benzonatate (TESSALON PERLES) 100  MG capsule, Take 1 capsule (100 mg total) by mouth 3 (three) times daily as needed., Disp: 20 capsule, Rfl: 0   predniSONE (STERAPRED UNI-PAK 21 TAB) 10 MG (21) TBPK tablet, Use as directed, Disp: 21 tablet, Rfl: 0   cetirizine (ZYRTEC) 10 MG chewable tablet, Chew 10 mg by mouth daily., Disp: , Rfl:    EPINEPHrine 0.3 mg/0.3 mL IJ SOAJ injection, Inject 0.3 mg into the  muscle as needed for anaphylaxis., Disp: , Rfl:    esomeprazole (NEXIUM) 40 MG capsule, Take 1 capsule (40 mg total) by mouth daily., Disp: 90 capsule, Rfl: 1   hydrocortisone-pramoxine (PROCTOFOAM HC) rectal foam, Place 1 applicator rectally 2 (two) times daily., Disp: 10 g, Rfl: 2   meloxicam (MOBIC) 15 MG tablet, Take 1 tablet (15 mg total) by mouth daily., Disp: 90 tablet, Rfl: 1   olmesartan-hydrochlorothiazide (BENICAR HCT) 40-25 MG tablet, Take 1 tablet by mouth daily., Disp: 90 tablet, Rfl: 0   topiramate (TOPAMAX) 25 MG tablet, Take 1 tablet (25 mg total) by mouth daily., Disp: 30 tablet, Rfl: 1  Observations/Objective: Patient is well-developed, well-nourished in no acute distress.  Resting comfortably  at home.  Head is normocephalic, atraumatic.  No labored breathing.  Speech is clear and coherent with logical content.  Patient is alert and oriented at baseline.  Dry nonproductive cough  Assessment and Plan: 1. Acute bronchitis, unspecified organism (Primary) - predniSONE (STERAPRED UNI-PAK 21 TAB) 10 MG (21) TBPK tablet; Use as directed  Dispense: 21 tablet; Refill: 0 - benzonatate (TESSALON PERLES) 100 MG capsule; Take 1 capsule (100 mg total) by mouth 3 (three) times daily as needed.  Dispense: 20 capsule; Refill: 0  - Take meds as prescribed - Use a cool mist humidifier  -Use saline nose sprays frequently -Force fluids -For any cough or congestion  Use plain Mucinex- regular strength or max strength is fine -For fever or aces or pains- take tylenol or ibuprofen. -Throat lozenges if help -Follow up if symptoms worsen or do not improve   Follow Up Instructions: I discussed the assessment and treatment plan with the patient. The patient was provided an opportunity to ask questions and all were answered. The patient agreed with the plan and demonstrated an understanding of the instructions.  A copy of instructions were sent to the patient via MyChart unless otherwise noted  below.     The patient was advised to call back or seek an in-person evaluation if the symptoms worsen or if the condition fails to improve as anticipated.    Jannifer Rodney, FNP

## 2023-08-23 ENCOUNTER — Encounter (HOSPITAL_BASED_OUTPATIENT_CLINIC_OR_DEPARTMENT_OTHER): Payer: Self-pay

## 2023-08-23 ENCOUNTER — Ambulatory Visit (HOSPITAL_BASED_OUTPATIENT_CLINIC_OR_DEPARTMENT_OTHER)
Admission: EM | Admit: 2023-08-23 | Discharge: 2023-08-23 | Disposition: A | Discharge status: Home / Self Care | Payer: 59 | Attending: Internal Medicine | Admitting: Internal Medicine

## 2023-08-23 DIAGNOSIS — J209 Acute bronchitis, unspecified: Secondary | ICD-10-CM

## 2023-08-23 DIAGNOSIS — J189 Pneumonia, unspecified organism: Secondary | ICD-10-CM | POA: Diagnosis not present

## 2023-08-23 DIAGNOSIS — R0602 Shortness of breath: Secondary | ICD-10-CM | POA: Diagnosis not present

## 2023-08-23 MED ORDER — ALBUTEROL SULFATE (2.5 MG/3ML) 0.083% IN NEBU
2.5000 mg | INHALATION_SOLUTION | Freq: Once | RESPIRATORY_TRACT | Status: AC
  Administered 2023-08-23: 2.5 mg via RESPIRATORY_TRACT

## 2023-08-23 MED ORDER — AMOXICILLIN-POT CLAVULANATE 875-125 MG PO TABS: 1.0000 | ORAL_TABLET | Freq: Two times a day (BID) | ORAL | 0 refills | Status: DC

## 2023-08-23 MED ORDER — PROMETHAZINE-DM 6.25-15 MG/5ML PO SYRP: 5.0000 mL | ORAL_SOLUTION | Freq: Every evening | ORAL | 0 refills | Status: DC | PRN

## 2023-08-23 MED ORDER — AZITHROMYCIN 250 MG PO TABS: 250.0000 mg | ORAL_TABLET | Freq: Every day | ORAL | 0 refills | Status: DC

## 2023-08-23 MED ORDER — AEROCHAMBER PLUS FLO-VU MEDIUM MISC: 1.0000 | Freq: Once | 0 refills | Status: AC

## 2023-08-23 MED ORDER — ALBUTEROL SULFATE HFA 108 (90 BASE) MCG/ACT IN AERS: 1.0000 | INHALATION_SPRAY | Freq: Four times a day (QID) | RESPIRATORY_TRACT | 0 refills | Status: DC | PRN

## 2023-08-23 NOTE — ED Provider Notes (Signed)
 [facility]    CSN: [id number] Arrival date & time: 08/23/23  1515      History   Chief Complaint Chief Complaint  Patient presents with   Cough   Nasal Congestion    HPI Adam Short is a 38 y.o. male.   Adam Short is a 38 y.o. male presenting for chief complaint of Cough and Nasal Congestion that started approximately 20 days ago (almost 3 weeks) on Sunday August 03, 2023. He was treating cough with OTC medications without relief for the first 2 weeks of illness, then did a virtual urgent care visit where he was felt to have bronchitis. He was prescribed prednisone  taper and tessalon  perles. Finished prednisone  taper today and cough has not improved. Shortness of breath has worsened and he reports fatigue. No recent fevers, chills, N/V/D, abdominal pain, rash, leg swelling, or orthopnea. Cough was dry initially but has become more productive over the last 4-5 days while on steroid. He was around his nieces approximately 4 weeks ago who had atypical pneumonia, otherwise no recent sick contacts with similar symptoms. Never smoker, no history of chronic respiratory problems.    Cough   Past Medical History:  Diagnosis Date   Essential hypertension 04/11/2022   Metabolic syndrome 04/11/2022    Patient Active Problem List   Diagnosis Date Noted   Pain of left heel 11/28/2022   Prediabetes 09/06/2022   Encounter for physical examination 08/30/2022   Class 2 severe obesity due to excess calories with serious comorbidity and body mass index (BMI) of 39.0 to 39.9 in adult (HCC) 08/30/2022   Excessive drinking of alcohol 08/30/2022   Obstructive sleep apnea 04/12/2022   Essential hypertension 04/11/2022   GERD (gastroesophageal reflux disease) 04/11/2022   Hyperlipidemia 04/11/2022    Past Surgical History:  Procedure Laterality Date   testicular tube surgery     VASECTOMY         Home Medications    Prior to Admission medications   Medication Sig Start  Date End Date Taking? Authorizing Provider  albuterol  (VENTOLIN  HFA) 108 (90 Base) MCG/ACT inhaler Inhale 1-2 puffs into the lungs every 6 (six) hours as needed for wheezing or shortness of breath. 08/23/23  Yes Enedelia Dorna HERO, FNP  amoxicillin -clavulanate (AUGMENTIN ) 875-125 MG tablet Take 1 tablet by mouth every 12 (twelve) hours. 08/23/23  Yes Enedelia Dorna HERO, FNP  azithromycin  (ZITHROMAX ) 250 MG tablet Take 1 tablet (250 mg total) by mouth daily. Take first 2 tablets together, then 1 every day until finished. 08/23/23  Yes Enedelia Dorna HERO, FNP  promethazine -dextromethorphan (PROMETHAZINE -DM) 6.25-15 MG/5ML syrup Take 5 mLs by mouth at bedtime as needed for cough. 08/23/23  Yes Enedelia Dorna HERO, FNP  Spacer/Aero-Holding Chambers (AEROCHAMBER PLUS FLO-VU MEDIUM) MISC 1 each by Other route once for 1 dose. 08/23/23 08/23/23 Yes StanhopeDorna HERO, FNP  benzonatate  (TESSALON  PERLES) 100 MG capsule Take 1 capsule (100 mg total) by mouth 3 (three) times daily as needed. 08/17/23   Lavell Bari LABOR, FNP  cetirizine (ZYRTEC) 10 MG chewable tablet Chew 10 mg by mouth daily.    [provider]  EPINEPHrine  0.3 mg/0.3 mL IJ SOAJ injection Inject 0.3 mg into the muscle as needed for anaphylaxis. 03/27/21   [provider]  esomeprazole  (NEXIUM ) 40 MG capsule Take 1 capsule (40 mg total) by mouth daily. 08/10/23   Nicholaus Credit, PA-C  hydrocortisone -pramoxine (PROCTOFOAM  HC) rectal foam Place 1 applicator rectally 2 (two) times daily. 05/13/23   Nicholaus Credit, PA-C  meloxicam  (MOBIC ) 15 MG tablet Take 1 tablet (15 mg total) by mouth daily. 03/07/23   Nicholaus Credit, PA-C  olmesartan -hydrochlorothiazide  (BENICAR  HCT) 40-25 MG tablet Take 1 tablet by mouth daily. 08/05/23   Nicholaus Credit, PA-C  predniSONE  (STERAPRED UNI-PAK 21 TAB) 10 MG (21) TBPK tablet Use as directed 08/17/23   Lavell Lye A, FNP  topiramate  (TOPAMAX ) 25 MG tablet Take 1 tablet (25 mg total) by mouth daily. 03/31/23    [facility], MD    Family History Family History  Problem Relation Age of Onset   Hypertension Mother    Cancer Father    Heart disease Maternal Grandfather     Social History Social History   Tobacco Use   Smoking status: Never   Smokeless tobacco: Never  Vaping Use   Vaping status: Never Used  Substance Use Topics   Alcohol use: Yes    Alcohol/week: 4.0 standard drinks of alcohol    Types: 4 Cans of beer per week    Comment: weekends   Drug use: Never     Allergies   Bee venom   Review of Systems Review of Systems  Respiratory:  Positive for cough.   Per HPI   Physical Exam Triage Vital Signs ED Triage Vitals  Encounter Vitals Group     BP 08/23/23 1534 133/89     Systolic BP Percentile --      Diastolic BP Percentile --      Pulse Rate 08/23/23 1534 100     Resp 08/23/23 1534 20     Temp 08/23/23 1534 97.8 F (36.6 C)     Temp Source 08/23/23 1534 Oral     SpO2 08/23/23 1534 93 %     Weight 08/23/23 1535 275 lb (124.7 kg)     Height --      Head Circumference --      Peak Flow --      Pain Score 08/23/23 1535 0     Pain Loc --      Pain Education --      Exclude from Growth Chart --    No data found.  Updated Vital Signs BP 133/89 (BP Location: Right Arm)   Pulse 100   Temp 97.8 F (36.6 C) (Oral)   Resp 20   Wt 275 lb (124.7 kg)   SpO2 97%   BMI 37.30 kg/m   Visual Acuity Right Eye Distance:   Left Eye Distance:   Bilateral Distance:    Right Eye Near:   Left Eye Near:    Bilateral Near:     Physical Exam Vitals and nursing note reviewed.  Constitutional:      Appearance: He is not ill-appearing or toxic-appearing.  HENT:     Head: Normocephalic and atraumatic.     Right Ear: Hearing, tympanic membrane, ear canal and external ear normal.     Left Ear: Hearing, tympanic membrane, ear canal and external ear normal.     Nose: Nose normal.     Mouth/Throat:     Lips: Pink.     Mouth: Mucous membranes are moist. No injury or  oral lesions.     Dentition: Normal dentition.     Tongue: No lesions.     Pharynx: Oropharynx is clear. Uvula midline. No pharyngeal swelling, oropharyngeal exudate, posterior oropharyngeal erythema, uvula swelling or postnasal drip.     Tonsils: No tonsillar exudate.  Eyes:     General: Lids are normal. Vision grossly intact. Gaze aligned appropriately.  Extraocular Movements: Extraocular movements intact.     Conjunctiva/sclera: Conjunctivae normal.  Neck:     Trachea: Trachea and phonation normal.  Cardiovascular:     Rate and Rhythm: Normal rate and regular rhythm.     Heart sounds: Normal heart sounds, S1 normal and S2 normal.  Pulmonary:     Effort: Pulmonary effort is normal. No respiratory distress.     Breath sounds: Normal air entry. Wheezing and rhonchi present.     Comments: Initial assessment: Diminished breath sounds throughout with bilateral expiratory wheezing and course rhonchi heard to bilateral lower lung fields on initial assessment. Speaking in full sentences without difficulty.  After breathing treatment: Prominent expiratory wheeze and rales localized to the right lower lung field with otherwise normal breath sounds. Musculoskeletal:     Cervical back: Neck supple.  Lymphadenopathy:     Cervical: No cervical adenopathy.  Skin:    General: Skin is warm and dry.     Capillary Refill: Capillary refill takes less than 2 seconds.     Findings: No rash.  Neurological:     General: No focal deficit present.     Mental Status: He is alert and oriented to person, place, and time. Mental status is at baseline.     Cranial Nerves: No dysarthria or facial asymmetry.  Psychiatric:        Mood and Affect: Mood normal.        Speech: Speech normal.        Behavior: Behavior normal.        Thought Content: Thought content normal.        Judgment: Judgment normal.      UC Treatments / Results  Labs (all labs ordered are listed, but only abnormal results are  displayed) Labs Reviewed - No data to display  EKG   Radiology No results found.  Procedures Procedures (including critical care time)  Medications Ordered in UC Medications  albuterol  (PROVENTIL ) (2.5 MG/3ML) 0.083% nebulizer solution 2.5 mg (2.5 mg Nebulization Given 08/23/23 1548)    Initial Impression / Assessment and Plan / UC Course  I have reviewed the triage vital signs and the nursing notes.  Pertinent labs & imaging results that were available during my care of the patient were reviewed by me and considered in my medical decision making (see chart for details).   1. Community acquired pneumonia of right lower lobe of lung, acute bronchitis, shortness of breath Evaluation highly suspicious for community acquired pneumonia to the right lower lobe. Lung sounds and subjective shortness of breath improved significantly after albuterol  breathing treatment in office revealing focal wheeze and rales to the RLL.  Augmentin  and azithromycin  ordered to treat pneumonia.  Albuterol  inhaler with spacer and promethazine  DM ordered for further symptomatic relief. Discussed supportive care with humidifier, mucinex, and tylenol /motrin as needed.  Advised to follow-up with PCP for re-evaluation in the next 2-3 days if symptoms worsen/go to ER for severe symptoms.  No clinical indication for chest x-ray given hemodynamically stable vital signs and high clinical suspicion for CAP.   Counseled patient on potential for adverse effects with medications prescribed/recommended today, strict ER and return-to-clinic precautions discussed, patient verbalized understanding.    Final Clinical Impressions(s) / UC Diagnoses   Final diagnoses:  Community acquired pneumonia of right lower lobe of lung  Acute bronchitis, unspecified organism  Shortness of breath   Discharge Instructions   None    ED Prescriptions     Medication Sig Dispense Auth. Provider  albuterol  (VENTOLIN  HFA) 108 (90 Base)  MCG/ACT inhaler Inhale 1-2 puffs into the lungs every 6 (six) hours as needed for wheezing or shortness of breath. 8 g Enedelia Dorna HERO, FNP   Spacer/Aero-Holding Chambers (AEROCHAMBER PLUS FLO-VU MEDIUM) MISC 1 each by Other route once for 1 dose. 1 each Enedelia Dorna HERO, FNP   promethazine -dextromethorphan (PROMETHAZINE -DM) 6.25-15 MG/5ML syrup Take 5 mLs by mouth at bedtime as needed for cough. 118 mL Enedelia Dorna M, FNP   amoxicillin -clavulanate (AUGMENTIN ) 875-125 MG tablet Take 1 tablet by mouth every 12 (twelve) hours. 14 tablet Enedelia Dorna M, FNP   azithromycin  (ZITHROMAX ) 250 MG tablet Take 1 tablet (250 mg total) by mouth daily. Take first 2 tablets together, then 1 every day until finished. 6 tablet Enedelia Dorna HERO, FNP      PDMP not reviewed this encounter.   Enedelia Dorna HERO, [place] 08/23/23 2032

## 2023-08-23 NOTE — ED Triage Notes (Signed)
 Had virtual visit last week. Dx with bronchitis last week. Given script for prednisone and tesselon perles. States still feeling bad. Reporting shortness of breath with exertion.

## 2023-09-01 ENCOUNTER — Other Ambulatory Visit (HOSPITAL_BASED_OUTPATIENT_CLINIC_OR_DEPARTMENT_OTHER): Payer: Self-pay

## 2023-09-01 ENCOUNTER — Ambulatory Visit (INDEPENDENT_AMBULATORY_CARE_PROVIDER_SITE_OTHER): Payer: 59 | Admitting: Physician Assistant

## 2023-09-01 ENCOUNTER — Encounter: Payer: Self-pay | Admitting: Physician Assistant

## 2023-09-01 VITALS — BP 130/86 | HR 88 | Temp 97.5°F | Resp 16 | Ht 72.0 in | Wt 293.6 lb

## 2023-09-01 DIAGNOSIS — J9801 Acute bronchospasm: Secondary | ICD-10-CM | POA: Insufficient documentation

## 2023-09-01 DIAGNOSIS — J189 Pneumonia, unspecified organism: Secondary | ICD-10-CM | POA: Insufficient documentation

## 2023-09-01 MED ORDER — TRIAMCINOLONE ACETONIDE 40 MG/ML IJ SUSP
80.0000 mg | Freq: Once | INTRAMUSCULAR | Status: AC
  Administered 2023-09-01: 80 mg via INTRAMUSCULAR

## 2023-09-01 MED ORDER — MONTELUKAST SODIUM 10 MG PO TABS
10.0000 mg | ORAL_TABLET | Freq: Every day | ORAL | 5 refills | Status: DC
  Filled 2023-09-01: qty 30, 30d supply, fill #0
  Filled 2023-09-28: qty 30, 30d supply, fill #1
  Filled 2023-10-27: qty 30, 30d supply, fill #2
  Filled 2023-11-26: qty 30, 30d supply, fill #3
  Filled 2024-02-03: qty 30, 30d supply, fill #4
  Filled 2024-05-13: qty 30, 30d supply, fill #5

## 2023-09-01 MED ORDER — PREDNISONE 20 MG PO TABS
ORAL_TABLET | ORAL | 0 refills | Status: AC
  Filled 2023-09-01: qty 18, 9d supply, fill #0

## 2023-09-01 NOTE — Progress Notes (Signed)
 Acute Office Visit  Subjective:    Patient ID: Adam Short, male    DOB: 12/06/1985, 38 y.o.   MRN: [id number]  Chief Complaint  Patient presents with   Pneumonia    Follow up    HPI: Patient is in today for follow up of pneumonia Pt did a virtual visit on 12/29 and diagnosed with bronchitis - he was prescribed prednisone  but only 10mg  dosing along with tessalon  perles He has albuterol  inhaler that he is using as needed He was then seen 08/23/23 at urgent care - he was diagnosed with pneumonia and given augmentin  with zpack - overall is doing some better but still having productive cough at times as well as wheezing He did not get chest xray at that time Denies fever   Current Outpatient Medications:    albuterol  (VENTOLIN  HFA) 108 (90 Base) MCG/ACT inhaler, Inhale 1-2 puffs into the lungs every 6 (six) hours as needed for wheezing or shortness of breath., Disp: 8 g, Rfl: 0   amoxicillin -clavulanate (AUGMENTIN ) 875-125 MG tablet, Take 1 tablet by mouth every 12 (twelve) hours., Disp: 14 tablet, Rfl: 0   benzonatate  (TESSALON  PERLES) 100 MG capsule, Take 1 capsule (100 mg total) by mouth 3 (three) times daily as needed., Disp: 20 capsule, Rfl: 0   cetirizine (ZYRTEC) 10 MG chewable tablet, Chew 10 mg by mouth daily., Disp: , Rfl:    EPINEPHrine  0.3 mg/0.3 mL IJ SOAJ injection, Inject 0.3 mg into the muscle as needed for anaphylaxis., Disp: , Rfl:    esomeprazole  (NEXIUM ) 40 MG capsule, Take 1 capsule (40 mg total) by mouth daily., Disp: 90 capsule, Rfl: 1   hydrocortisone -pramoxine (PROCTOFOAM  HC) rectal foam, Place 1 applicator rectally 2 (two) times daily., Disp: 10 g, Rfl: 2   meloxicam  (MOBIC ) 15 MG tablet, Take 1 tablet (15 mg total) by mouth daily., Disp: 90 tablet, Rfl: 1   montelukast  (SINGULAIR ) 10 MG tablet, Take 1 tablet (10 mg total) by mouth at bedtime., Disp: 30 tablet, Rfl: 5   olmesartan -hydrochlorothiazide  (BENICAR  HCT) 40-25 MG tablet, Take 1 tablet by mouth  daily., Disp: 90 tablet, Rfl: 0   predniSONE  (DELTASONE ) 20 MG tablet, 1 po tid for 3 days then 1 po bid for 3 days then 1 po qd for 3 days, Disp: 18 tablet, Rfl: 0   promethazine -dextromethorphan (PROMETHAZINE -DM) 6.25-15 MG/5ML syrup, Take 5 mLs by mouth at bedtime as needed for cough., Disp: 118 mL, Rfl: 0   Spacer/Aero-Holding Chambers (OPTICHAMBER DIAMOND) MISC, See admin instructions. use with inhaler, Disp: , Rfl:   Current Facility-Administered Medications:    triamcinolone  acetonide (KENALOG -40) injection 80 mg, 80 mg, Intramuscular, Once,   Allergies  Allergen Reactions   Bee Venom     ROS CONSTITUTIONAL: Negative for chills, fatigue, fever,  E/N/T: has had some congestion CARDIOVASCULAR: Negative for chest pain, dizziness, palpitations and pedal edema.  RESPIRATORY: see HPI GASTROINTESTINAL: Negative for abdominal pain, acid reflux symptoms, constipation, diarrhea, nausea and vomiting.       Objective:    PHYSICAL EXAM:   BP 130/86 (BP Location: Left Arm, Patient Position: Sitting, Cuff Size: Large)   Pulse 88   Temp (!) 97.5 F (36.4 C) (Temporal)   Resp 16   Ht 6' (1.829 m)   Wt 293 lb 9.6 oz (133.2 kg)   SpO2 98%   BMI 39.82 kg/m    GEN: Well nourished, well developed, in no acute distress  HEENT: normal external ears and nose - normal external auditory canals  and TMS - - Lips, Teeth and Gums - normal  Oropharynx - normal mucosa, palate, and posterior pharynx Cardiac: RRR; no murmurs, rubs, Respiratory:  rhonchi noted, scattered and clearing with cough (improvement from 08/23/23) Skin: warm and dry, no rash      Assessment & Plan:    Pneumonia of both lower lobes due to infectious organism -     Triamcinolone  Acetonide 80mg  IM -     Montelukast  Sodium; Take 1 tablet (10 mg total) by mouth at bedtime.  Dispense: 30 tablet; Refill: 5  Bronchospasm -     Triamcinolone  Acetonide 80 mg IM -     predniSONE ; 1 po tid for 3 days then 1 po bid for 3 days then  1 po qd for 3 days  Dispense: 18 tablet; Refill: 0 -     Montelukast  Sodium; Take 1 tablet (10 mg total) by mouth at bedtime.  Dispense: 30 tablet; Refill: 5 Finish antibiotics as directed If symptoms persist call back and will order chest xray    Follow-up: Return if symptoms worsen or fail to improve.  An After Visit Summary was printed and given to the patient.  CAMIE JONELLE NICHOLAUS DEVONNA [facility] [phone]

## 2023-09-29 ENCOUNTER — Other Ambulatory Visit (HOSPITAL_BASED_OUTPATIENT_CLINIC_OR_DEPARTMENT_OTHER): Payer: Self-pay

## 2023-09-30 ENCOUNTER — Other Ambulatory Visit (HOSPITAL_BASED_OUTPATIENT_CLINIC_OR_DEPARTMENT_OTHER): Payer: Self-pay

## 2023-10-25 DIAGNOSIS — G4733 Obstructive sleep apnea (adult) (pediatric): Secondary | ICD-10-CM | POA: Diagnosis not present

## 2023-10-27 ENCOUNTER — Other Ambulatory Visit (HOSPITAL_BASED_OUTPATIENT_CLINIC_OR_DEPARTMENT_OTHER): Payer: Self-pay

## 2023-11-06 ENCOUNTER — Other Ambulatory Visit: Payer: Self-pay | Admitting: Physician Assistant

## 2023-11-06 ENCOUNTER — Other Ambulatory Visit (HOSPITAL_BASED_OUTPATIENT_CLINIC_OR_DEPARTMENT_OTHER): Payer: Self-pay

## 2023-11-06 DIAGNOSIS — I10 Essential (primary) hypertension: Secondary | ICD-10-CM

## 2023-11-06 MED ORDER — OLMESARTAN MEDOXOMIL-HCTZ 40-25 MG PO TABS
1.0000 | ORAL_TABLET | Freq: Every day | ORAL | 0 refills | Status: DC
  Filled 2023-11-06: qty 90, 90d supply, fill #0

## 2023-11-11 ENCOUNTER — Other Ambulatory Visit (HOSPITAL_BASED_OUTPATIENT_CLINIC_OR_DEPARTMENT_OTHER): Payer: Self-pay

## 2023-11-26 ENCOUNTER — Other Ambulatory Visit (HOSPITAL_BASED_OUTPATIENT_CLINIC_OR_DEPARTMENT_OTHER): Payer: Self-pay

## 2024-01-06 ENCOUNTER — Other Ambulatory Visit (HOSPITAL_BASED_OUTPATIENT_CLINIC_OR_DEPARTMENT_OTHER): Payer: Self-pay

## 2024-01-25 DIAGNOSIS — G4733 Obstructive sleep apnea (adult) (pediatric): Secondary | ICD-10-CM | POA: Diagnosis not present

## 2024-02-03 ENCOUNTER — Other Ambulatory Visit: Payer: Self-pay | Admitting: Physician Assistant

## 2024-02-03 ENCOUNTER — Other Ambulatory Visit (HOSPITAL_BASED_OUTPATIENT_CLINIC_OR_DEPARTMENT_OTHER): Payer: Self-pay

## 2024-02-03 DIAGNOSIS — I10 Essential (primary) hypertension: Secondary | ICD-10-CM

## 2024-02-05 ENCOUNTER — Other Ambulatory Visit (HOSPITAL_BASED_OUTPATIENT_CLINIC_OR_DEPARTMENT_OTHER): Payer: Self-pay

## 2024-02-17 ENCOUNTER — Other Ambulatory Visit (HOSPITAL_BASED_OUTPATIENT_CLINIC_OR_DEPARTMENT_OTHER): Payer: Self-pay

## 2024-02-17 ENCOUNTER — Ambulatory Visit (INDEPENDENT_AMBULATORY_CARE_PROVIDER_SITE_OTHER): Admitting: Physician Assistant

## 2024-02-17 ENCOUNTER — Encounter: Payer: Self-pay | Admitting: Physician Assistant

## 2024-02-17 VITALS — BP 120/88 | HR 79 | Temp 97.6°F | Ht 72.0 in | Wt 290.8 lb

## 2024-02-17 DIAGNOSIS — I10 Essential (primary) hypertension: Secondary | ICD-10-CM

## 2024-02-17 DIAGNOSIS — Z23 Encounter for immunization: Secondary | ICD-10-CM | POA: Diagnosis not present

## 2024-02-17 DIAGNOSIS — Z0000 Encounter for general adult medical examination without abnormal findings: Secondary | ICD-10-CM | POA: Diagnosis not present

## 2024-02-17 LAB — POCT URINALYSIS DIP (CLINITEK)
Bilirubin, UA: NEGATIVE
Blood, UA: NEGATIVE
Glucose, UA: NEGATIVE mg/dL
Ketones, POC UA: NEGATIVE mg/dL
Leukocytes, UA: NEGATIVE
Nitrite, UA: NEGATIVE
POC PROTEIN,UA: NEGATIVE
Spec Grav, UA: 1.02 (ref 1.010–1.025)
Urobilinogen, UA: 0.2 U/dL
pH, UA: 6 (ref 5.0–8.0)

## 2024-02-17 MED ORDER — OLMESARTAN MEDOXOMIL-HCTZ 40-25 MG PO TABS
1.0000 | ORAL_TABLET | Freq: Every day | ORAL | 1 refills | Status: AC
  Filled 2024-02-17: qty 90, 90d supply, fill #0
  Filled 2024-05-13: qty 90, 90d supply, fill #1

## 2024-02-17 MED ORDER — EPINEPHRINE 0.3 MG/0.3ML IJ SOAJ
0.3000 mg | INTRAMUSCULAR | 1 refills | Status: AC | PRN
  Filled 2024-02-17: qty 2, 2d supply, fill #0

## 2024-02-17 NOTE — Progress Notes (Addendum)
 Subjective:  Patient ID: Adam Short, male    DOB: [birth year]  Age: 38 y.o. MRN: [id number]  Chief Complaint  Patient presents with   Annual Exam    HPI Well Adult Physical: Patient here for a comprehensive physical exam.The patient reports he has had some intermittent ruq pain - had gallbladder ultrasound done a few years ago and told he had a fatty liver but no gallstones -  Do you take any herbs or supplements that were not prescribed by a doctor? no Are you taking calcium supplements? no Are you taking aspirin daily? no  Encounter for general adult medical examination without abnormal findings  Physical (At Risk items are starred): Patient's last physical exam was 1 year ago .  Patient is not afflicted from Stress Incontinence and Urge Incontinence  Patient wears a seat belt, has smoke detectors, has carbon monoxide detectors, practices appropriate gun safety, and wears sunscreen with extended sun exposure. Dental Care: brushes and flosses daily. Last dental visit: up to date Vision impairments: none Hearing loss: none Self Testicular Exam: Yes     02/17/2024    3:39 PM 03/07/2023    8:39 AM 01/29/2023    3:59 PM 08/30/2022    8:45 AM  Depression screen PHQ 2/9  Decreased Interest 0 0 0 0  Down, Depressed, Hopeless 0 0 0 0  PHQ - 2 Score 0 0 0 0         08/30/2022    8:44 AM 09/06/2022    7:34 AM 01/29/2023    3:58 PM 03/07/2023    8:39 AM 02/17/2024    3:38 PM  Fall Risk  Falls in the past year?  0 0  0  Was there an injury with Fall? 0 0 0 0 0  Fall Risk Category Calculator  0 0  0  (RETIRED) Patient Fall Risk Level Low fall risk       Patient at Risk for Falls Due to No Fall Risks  No Fall Risks No Fall Risks No Fall Risks  Fall risk Follow up Falls evaluation completed  Falls evaluation completed  Falls evaluation completed Falls evaluation completed      Data saved with a previous flowsheet row definition             Social Hx   Social History    Socioeconomic History   Marital status: Married    Spouse name: Not on file   Number of children: Not on file   Years of education: Not on file   Highest education level: 12th grade  Occupational History   Not on file  Tobacco Use   Smoking status: Never   Smokeless tobacco: Never  Vaping Use   Vaping status: Never Used  Substance and Sexual Activity   Alcohol use: Yes    Alcohol/week: 4.0 standard drinks of alcohol    Types: 4 Cans of beer per week    Comment: weekends   Drug use: Never   Sexual activity: Yes    Partners: Female  Other Topics Concern   Not on file  Social History Narrative   Not on file   Social Drivers of Health   Financial Resource Strain: Low Risk  (01/28/2023)   Overall Financial Resource Strain (CARDIA)    Difficulty of Paying Living Expenses: Not hard at all  Food Insecurity: No Food Insecurity (01/28/2023)   Hunger Vital Sign    Worried About Running Out of Food in the Last Year: Never true  Ran Out of Food in the Last Year: Never true  Transportation Needs: No Transportation Needs (01/28/2023)   PRAPARE - Administrator, Civil Service (Medical): No    Lack of Transportation (Non-Medical): No  Physical Activity: Insufficiently Active (01/28/2023)   Exercise Vital Sign    Days of Exercise per Week: 3 days    Minutes of Exercise per Session: 20 min  Stress: No Stress Concern Present (01/28/2023)   [employer] of Occupational Health - Occupational Stress Questionnaire    Feeling of Stress : Not at all  Social Connections: Moderately Integrated (01/28/2023)   Social Connection and Isolation Panel    Frequency of Communication with Friends and Family: More than three times a week    Frequency of Social Gatherings with Friends and Family: More than three times a week    Attends Religious Services: 1 to 4 times per year    Active Member of [employer] or Organizations: No    Attends Engineer, structural: Not on file    Marital  Status: Married   Past Medical History:  Diagnosis Date   Essential hypertension 04/11/2022   Metabolic syndrome 04/11/2022   Past Surgical History:  Procedure Laterality Date   testicular tube surgery     VASECTOMY      Family History  Problem Relation Age of Onset   Hypertension Mother    Cancer Father    Heart disease Maternal Grandfather     ROS CONSTITUTIONAL: Negative for chills, fatigue, fever, unintentional weight gain and unintentional weight loss.  E/N/T: Negative for ear pain, nasal congestion and sore throat.  CARDIOVASCULAR: Negative for chest pain, dizziness, palpitations and pedal edema.  RESPIRATORY: Negative for recent cough and dyspnea.  GASTROINTESTINAL: Negative for abdominal pain, acid reflux symptoms, constipation, diarrhea, nausea and vomiting.  MSK: Negative for arthralgias and myalgias.  INTEGUMENTARY: Negative for rash.  NEUROLOGICAL: Negative for dizziness and headaches.  PSYCHIATRIC: Negative for sleep disturbance and to question depression screen.  Negative for depression, negative for anhedonia.   Objective:  PHYSICAL EXAM:   BP 120/88   Pulse 79   Temp 97.6 F (36.4 C)   Ht 6' (1.829 m)   Wt 290 lb 12.8 oz (131.9 kg)   SpO2 98%   BMI 39.44 kg/m   Vision Screening   Right eye Left eye Both eyes  Without correction 20/25 20/25 20/25   With correction       GEN: Well nourished, well developed, in no acute distress  HEENT: normal external ears and nose - normal external auditory canals and TMS - hearing grossly normal -  Lips, Teeth and Gums - normal  Oropharynx - normal mucosa, palate, and posterior pharynx Neck: no JVD or masses - no thyromegaly Cardiac: RRR; no murmurs, rubs, or gallops,no edema - no significant varicosities Respiratory:  normal respiratory rate and pattern with no distress - normal breath sounds with no rales, rhonchi, wheezes or rubs GI: normal bowel sounds, no masses or tenderness MS: no deformity or atrophy   Skin: warm and dry, no rash  Neuro:  Alert and Oriented x 3, Strength and sensation are intact - CN II-Xii grossly intact Psych: euthymic mood, appropriate affect and demeanor Office Visit on 02/17/2024  Component Date Value Ref Range Status   Color, UA 02/17/2024 yellow  yellow Final   Clarity, UA 02/17/2024 clear  clear Final   Glucose, UA 02/17/2024 negative  negative mg/dL Final   Bilirubin, UA 92/98/7974 negative  negative Final  Ketones, POC UA 02/17/2024 negative  negative mg/dL Final   Spec Grav, UA 92/98/7974 1.020  1.010 - 1.025 Final   Blood, UA 02/17/2024 negative  negative Final   pH, UA 02/17/2024 6.0  5.0 - 8.0 Final   POC PROTEIN,UA 02/17/2024 negative  negative, trace Final   Urobilinogen, UA 02/17/2024 0.2  0.2 or 1.0 E.U./dL Final   Nitrite, UA 92/98/7974 Negative  Negative Final   Leukocytes, UA 02/17/2024 Negative  Negative Final    Assessment & Plan:  Annual physical exam -     CBC with Differential/Platelet; Future -     Comprehensive metabolic panel with GFR; Future -     TSH; Future -     Lipid panel; Future  Need for diphtheria-tetanus-pertussis (Tdap) vaccine -     Tdap vaccine greater than or equal to 7yo IM  Essential hypertension -     POCT URINALYSIS DIP (CLINITEK) -     Olmesartan  Medoxomil-HCTZ; Take 1 tablet by mouth daily.  Dispense: 90 tablet; Refill: 1  Other orders -     EPINEPHrine ; Inject 0.3 mg into the muscle as needed for anaphylaxis.  Dispense: 2 each; Refill: 1    This is a list of the screening recommended for you and due dates:  Health Maintenance  Topic Date Due   DTaP/Tdap/Td vaccine (1 - Tdap) Never done   Hepatitis B Vaccine (1 of 3 - 19+ 3-dose series) 02/16/2025*   HPV Vaccine (1 - 3-dose SCDM series) 02/16/2025*   Flu Shot  03/19/2024   Meningitis B Vaccine  Aged Out   COVID-19 Vaccine  Discontinued   Hepatitis C Screening  Discontinued   HIV Screening  Discontinued  *Topic was postponed. The date shown is not  the original due date.     Follow-up: Return in about 6 months (around 08/19/2024) for chronic fasting follow-up and schedule fasting labs for Monday.  An After Visit Summary was printed and given to the patient.  CAMIE JONELLE NICHOLAUS DEVONNA [facility] [phone]

## 2024-02-18 ENCOUNTER — Other Ambulatory Visit (HOSPITAL_BASED_OUTPATIENT_CLINIC_OR_DEPARTMENT_OTHER): Payer: Self-pay

## 2024-02-18 ENCOUNTER — Other Ambulatory Visit: Payer: Self-pay | Admitting: Physician Assistant

## 2024-02-18 MED ORDER — ESOMEPRAZOLE MAGNESIUM 40 MG PO CPDR
40.0000 mg | DELAYED_RELEASE_CAPSULE | Freq: Every day | ORAL | 1 refills | Status: DC
  Filled 2024-02-18: qty 90, 90d supply, fill #0

## 2024-02-23 ENCOUNTER — Other Ambulatory Visit

## 2024-02-26 ENCOUNTER — Other Ambulatory Visit: Payer: Self-pay | Admitting: Physician Assistant

## 2024-02-26 ENCOUNTER — Telehealth: Payer: Self-pay

## 2024-02-26 ENCOUNTER — Other Ambulatory Visit (HOSPITAL_BASED_OUTPATIENT_CLINIC_OR_DEPARTMENT_OTHER): Payer: Self-pay

## 2024-02-26 ENCOUNTER — Ambulatory Visit: Payer: Self-pay | Admitting: Physician Assistant

## 2024-02-26 ENCOUNTER — Ambulatory Visit (HOSPITAL_BASED_OUTPATIENT_CLINIC_OR_DEPARTMENT_OTHER)
Admission: RE | Admit: 2024-02-26 | Discharge: 2024-02-26 | Disposition: A | Discharge status: Home / Self Care | Source: Ambulatory Visit | Attending: Physician Assistant | Admitting: Physician Assistant

## 2024-02-26 ENCOUNTER — Ambulatory Visit (INDEPENDENT_AMBULATORY_CARE_PROVIDER_SITE_OTHER): Admitting: Physician Assistant

## 2024-02-26 ENCOUNTER — Other Ambulatory Visit

## 2024-02-26 ENCOUNTER — Encounter: Payer: Self-pay | Admitting: Physician Assistant

## 2024-02-26 VITALS — BP 118/82 | HR 78 | Temp 97.5°F | Ht 72.0 in | Wt 286.2 lb

## 2024-02-26 DIAGNOSIS — R110 Nausea: Secondary | ICD-10-CM | POA: Diagnosis not present

## 2024-02-26 DIAGNOSIS — R1011 Right upper quadrant pain: Secondary | ICD-10-CM | POA: Diagnosis not present

## 2024-02-26 DIAGNOSIS — K219 Gastro-esophageal reflux disease without esophagitis: Secondary | ICD-10-CM

## 2024-02-26 DIAGNOSIS — Z0000 Encounter for general adult medical examination without abnormal findings: Secondary | ICD-10-CM

## 2024-02-26 LAB — POCT URINALYSIS DIP (CLINITEK)
Bilirubin, UA: NEGATIVE
Blood, UA: NEGATIVE
Glucose, UA: NEGATIVE mg/dL
Ketones, POC UA: NEGATIVE mg/dL
Leukocytes, UA: NEGATIVE
Nitrite, UA: NEGATIVE
Spec Grav, UA: 1.015 (ref 1.010–1.025)
Urobilinogen, UA: 0.2 U/dL
pH, UA: 6 (ref 5.0–8.0)

## 2024-02-26 MED ORDER — ONDANSETRON HCL 4 MG PO TABS
4.0000 mg | ORAL_TABLET | Freq: Three times a day (TID) | ORAL | 0 refills | Status: AC | PRN
  Filled 2024-02-26: qty 20, 7d supply, fill #0

## 2024-02-26 MED ORDER — PANTOPRAZOLE SODIUM 40 MG PO TBEC
40.0000 mg | DELAYED_RELEASE_TABLET | Freq: Every day | ORAL | 0 refills | Status: DC
  Filled 2024-02-26: qty 90, 90d supply, fill #0

## 2024-02-26 NOTE — Progress Notes (Signed)
 Acute Office Visit  Subjective:    Patient ID: Adam Short, male    DOB: 02-07-1986, 38 y.o.   MRN: [id number]  Chief Complaint  Patient presents with   Abdominal Pain    HPI: Patient is in today for complaints of ruq pain, nausea and low grade fever Pt states that over the weekend he noted some right upper back discomfort and then since Monday the pain is more in the RUQ of stomach that radiates to his back.  States he has had nausea and gassiness and symptoms worsen after eating.  He has had a temp of 99 intermittently the past few days.  Yesterday pain was more severe.  He does take nexium  and states his reflux symptoms also seem worse Pt had gallbladder ultrasound a few years ago which was negative for gallstones Pt denies urine symptoms - no dysuria, urgency or hematuria   Current Outpatient Medications:    cetirizine (ZYRTEC) 10 MG chewable tablet, Chew 10 mg by mouth daily., Disp: , Rfl:    EPINEPHrine  0.3 mg/0.3 mL IJ SOAJ injection, Inject 0.3 mg into the muscle as needed for anaphylaxis., Disp: 2 each, Rfl: 1   hydrocortisone -pramoxine (PROCTOFOAM  HC) rectal foam, Place 1 applicator rectally 2 (two) times daily., Disp: 10 g, Rfl: 2   meloxicam  (MOBIC ) 15 MG tablet, Take 1 tablet (15 mg total) by mouth daily., Disp: 90 tablet, Rfl: 1   montelukast  (SINGULAIR ) 10 MG tablet, Take 1 tablet (10 mg total) by mouth at bedtime., Disp: 30 tablet, Rfl: 5   olmesartan -hydrochlorothiazide  (BENICAR  HCT) 40-25 MG tablet, Take 1 tablet by mouth daily., Disp: 90 tablet, Rfl: 1   ondansetron  (ZOFRAN ) 4 MG tablet, Take 1 tablet (4 mg total) by mouth every 8 (eight) hours as needed for nausea or vomiting., Disp: 20 tablet, Rfl: 0   pantoprazole  (PROTONIX ) 40 MG tablet, Take 1 tablet (40 mg total) by mouth daily., Disp: 90 tablet, Rfl: 0  Allergies  Allergen Reactions   Bee Venom     ROS CONSTITUTIONAL: see HPI E/N/T: Negative for ear pain, nasal congestion and sore throat.   CARDIOVASCULAR: Negative for chest pain, dizziness, RESPIRATORY: Negative for recent cough and dyspnea.  GASTROINTESTINAL: see HPI GU - negative INTEGUMENTARY: Negative for rash.       Objective:    PHYSICAL EXAM:   BP 118/82   Pulse 78   Temp (!) 97.5 F (36.4 C)   Ht 6' (1.829 m)   Wt 286 lb 3.2 oz (129.8 kg)   SpO2 98%   BMI 38.82 kg/m    GEN: Well nourished, well developed, in no acute distress   Cardiac: RRR; no murmurs, rubs, Respiratory:  normal respiratory rate and pattern with no distress - normal breath sounds with no rales, rhonchi, wheezes or rubs GI: normal bowel sounds, no masses does have mild ruq pain to palpation ---- no CVA tenderness  Skin: warm and dry, no rash   Office Visit on 02/26/2024  Component Date Value Ref Range Status   Color, UA 02/26/2024 yellow  yellow Final   Clarity, UA 02/26/2024 clear  clear Final   Glucose, UA 02/26/2024 negative  negative mg/dL Final   Bilirubin, UA 92/89/7974 negative  negative Final   Ketones, POC UA 02/26/2024 negative  negative mg/dL Final   Spec Grav, UA 92/89/7974 1.015  1.010 - 1.025 Final   Blood, UA 02/26/2024 negative  negative Final   pH, UA 02/26/2024 6.0  5.0 - 8.0 Final   POC PROTEIN,UA  02/26/2024 trace  negative, trace Final   Urobilinogen, UA 02/26/2024 0.2  0.2 or 1.0 E.U./dL Final   Nitrite, UA 92/89/7974 Negative  Negative Final   Leukocytes, UA 02/26/2024 Negative  Negative Final        Assessment & Plan:    RUQ pain Sent for stat ultrasound (Bloodwork was done this morning that was ordered from his physical a few days ago that includes cbc, cmp, tsh, lipid)  Nausea Rx zofran   GERD  Stop nexium  Rx for protonix  40mg  qd  Follow-up: Return if symptoms worsen or fail to improve.  An After Visit Summary was printed and given to the patient.  CAMIE JONELLE NICHOLAUS DEVONNA [facility] [phone]

## 2024-02-26 NOTE — Telephone Encounter (Signed)
 Copied from CRM [id number]. Topic: Clinical - Prescription Issue >> Feb 26, 2024  1:45 PM Sophia H wrote: Reason for CRM: Spoke with Alm from [employer] who is calling in on behalf of the patient, patient currently at pharmacy. States Esomeprazole  was discontinued and Pantoprazole  should've been sent in place for the patient and was never received. Please advise/send over new RX. I don't see either medications in chart, # [phone]    [facility]

## 2024-02-27 ENCOUNTER — Other Ambulatory Visit (HOSPITAL_BASED_OUTPATIENT_CLINIC_OR_DEPARTMENT_OTHER): Payer: Self-pay

## 2024-02-27 LAB — COMPREHENSIVE METABOLIC PANEL WITH GFR
ALT: 53 IU/L — ABNORMAL HIGH (ref 0–44)
AST: 45 IU/L — ABNORMAL HIGH (ref 0–40)
Albumin: 4.2 g/dL (ref 4.1–5.1)
Alkaline Phosphatase: 106 IU/L (ref 44–121)
BUN/Creatinine Ratio: 12 (ref 9–20)
BUN: 13 mg/dL (ref 6–20)
Bilirubin Total: 0.5 mg/dL (ref 0.0–1.2)
CO2: 21 mmol/L (ref 20–29)
Calcium: 8.8 mg/dL (ref 8.7–10.2)
Chloride: 101 mmol/L (ref 96–106)
Creatinine, Ser: 1.12 mg/dL (ref 0.76–1.27)
Globulin, Total: 2.8 g/dL (ref 1.5–4.5)
Glucose: 103 mg/dL — ABNORMAL HIGH (ref 70–99)
Potassium: 4.2 mmol/L (ref 3.5–5.2)
Sodium: 136 mmol/L (ref 134–144)
Total Protein: 7 g/dL (ref 6.0–8.5)
eGFR: 86 mL/min/1.73 (ref >59)

## 2024-02-27 LAB — CBC WITH DIFFERENTIAL/PLATELET
Basophils Absolute: 0.1 x10E3/uL (ref 0.0–0.2)
Basos: 1 %
EOS (ABSOLUTE): 0.1 x10E3/uL (ref 0.0–0.4)
Eos: 2 %
Hematocrit: 44.3 % (ref 37.5–51.0)
Hemoglobin: 15.2 g/dL (ref 13.0–17.7)
Immature Grans (Abs): 0 x10E3/uL (ref 0.0–0.1)
Immature Granulocytes: 0 %
Lymphocytes Absolute: 1.7 x10E3/uL (ref 0.7–3.1)
Lymphs: 28 %
MCH: 30.2 pg (ref 26.6–33.0)
MCHC: 34.3 g/dL (ref 31.5–35.7)
MCV: 88 fL (ref 79–97)
Monocytes Absolute: 1 x10E3/uL — ABNORMAL HIGH (ref 0.1–0.9)
Monocytes: 17 %
Neutrophils Absolute: 3.1 x10E3/uL (ref 1.4–7.0)
Neutrophils: 51 %
Platelets: 220 x10E3/uL (ref 150–450)
RBC: 5.03 x10E6/uL (ref 4.14–5.80)
RDW: 12.7 % (ref 11.6–15.4)
WBC: 6 x10E3/uL (ref 3.4–10.8)

## 2024-02-27 LAB — LIPID PANEL
Chol/HDL Ratio: 7 ratio — ABNORMAL HIGH (ref 0.0–5.0)
Cholesterol, Total: 217 mg/dL — ABNORMAL HIGH (ref 100–199)
HDL: 31 mg/dL — ABNORMAL LOW (ref >39)
LDL Chol Calc (NIH): 160 mg/dL — ABNORMAL HIGH (ref 0–99)
Triglycerides: 139 mg/dL (ref 0–149)
VLDL Cholesterol Cal: 26 mg/dL (ref 5–40)

## 2024-02-27 LAB — TSH: TSH: 4.72 u[IU]/mL — ABNORMAL HIGH (ref 0.450–4.500)

## 2024-03-02 DIAGNOSIS — R110 Nausea: Secondary | ICD-10-CM | POA: Diagnosis not present

## 2024-03-03 ENCOUNTER — Other Ambulatory Visit: Payer: Self-pay | Admitting: Physician Assistant

## 2024-03-03 ENCOUNTER — Encounter: Payer: Self-pay | Admitting: Physician Assistant

## 2024-03-03 ENCOUNTER — Ambulatory Visit: Payer: Self-pay | Admitting: Physician Assistant

## 2024-03-03 DIAGNOSIS — K828 Other specified diseases of gallbladder: Secondary | ICD-10-CM

## 2024-03-03 DIAGNOSIS — R110 Nausea: Secondary | ICD-10-CM | POA: Diagnosis not present

## 2024-03-08 ENCOUNTER — Telehealth: Payer: Self-pay

## 2024-03-08 ENCOUNTER — Other Ambulatory Visit (HOSPITAL_BASED_OUTPATIENT_CLINIC_OR_DEPARTMENT_OTHER): Payer: Self-pay

## 2024-03-08 NOTE — Telephone Encounter (Signed)
 Wife made aware

## 2024-03-08 NOTE — Telephone Encounter (Signed)
 PatieNT wife called aND STATED Halley's BP has been running low. He felt dizzy yesterday and has a few times the past couple weeks so I checked his BP yesterday and it was 112/72. 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 I am thinking his BP meds need to be adjusted.

## 2024-03-08 NOTE — Telephone Encounter (Signed)
 His bp in office last 2 visits were 118/82 and 120/78 --- neither are low 112/72 is also normal bp  Continue to monitor symptoms and bp - and if symptoms persist to follow up

## 2024-03-09 DIAGNOSIS — R110 Nausea: Secondary | ICD-10-CM | POA: Diagnosis not present

## 2024-03-09 DIAGNOSIS — K828 Other specified diseases of gallbladder: Secondary | ICD-10-CM | POA: Diagnosis not present

## 2024-03-09 DIAGNOSIS — R1011 Right upper quadrant pain: Secondary | ICD-10-CM | POA: Diagnosis not present

## 2024-03-09 DIAGNOSIS — I10 Essential (primary) hypertension: Secondary | ICD-10-CM | POA: Diagnosis not present

## 2024-03-12 ENCOUNTER — Other Ambulatory Visit (HOSPITAL_COMMUNITY)

## 2024-03-15 ENCOUNTER — Other Ambulatory Visit (HOSPITAL_BASED_OUTPATIENT_CLINIC_OR_DEPARTMENT_OTHER): Payer: Self-pay

## 2024-03-15 DIAGNOSIS — Z9049 Acquired absence of other specified parts of digestive tract: Secondary | ICD-10-CM | POA: Diagnosis not present

## 2024-03-15 DIAGNOSIS — K811 Chronic cholecystitis: Secondary | ICD-10-CM | POA: Diagnosis not present

## 2024-03-15 DIAGNOSIS — K828 Other specified diseases of gallbladder: Secondary | ICD-10-CM | POA: Diagnosis not present

## 2024-03-15 MED ORDER — TRAMADOL HCL 50 MG PO TABS
50.0000 mg | ORAL_TABLET | Freq: Four times a day (QID) | ORAL | 0 refills | Status: AC | PRN
Start: 2024-03-15 — End: ?
  Filled 2024-03-15: qty 20, 5d supply, fill #0

## 2024-04-22 DIAGNOSIS — T1501XA Foreign body in cornea, right eye, initial encounter: Secondary | ICD-10-CM | POA: Diagnosis not present

## 2024-04-23 DIAGNOSIS — T1501XD Foreign body in cornea, right eye, subsequent encounter: Secondary | ICD-10-CM | POA: Diagnosis not present

## 2024-04-27 DIAGNOSIS — G4733 Obstructive sleep apnea (adult) (pediatric): Secondary | ICD-10-CM | POA: Diagnosis not present

## 2024-05-13 ENCOUNTER — Other Ambulatory Visit (HOSPITAL_BASED_OUTPATIENT_CLINIC_OR_DEPARTMENT_OTHER): Payer: Self-pay

## 2024-05-20 ENCOUNTER — Other Ambulatory Visit (HOSPITAL_BASED_OUTPATIENT_CLINIC_OR_DEPARTMENT_OTHER): Payer: Self-pay

## 2024-05-20 ENCOUNTER — Other Ambulatory Visit: Payer: Self-pay | Admitting: Physician Assistant

## 2024-05-20 MED ORDER — PANTOPRAZOLE SODIUM 40 MG PO TBEC
40.0000 mg | DELAYED_RELEASE_TABLET | Freq: Every day | ORAL | 1 refills | Status: AC
  Filled 2024-05-20: qty 90, 90d supply, fill #0
  Filled 2024-08-21: qty 90, 90d supply, fill #1

## 2024-06-14 DIAGNOSIS — Z01818 Encounter for other preprocedural examination: Secondary | ICD-10-CM | POA: Diagnosis not present

## 2024-07-21 DIAGNOSIS — D126 Benign neoplasm of colon, unspecified: Secondary | ICD-10-CM | POA: Diagnosis not present

## 2024-07-21 DIAGNOSIS — K635 Polyp of colon: Secondary | ICD-10-CM | POA: Diagnosis not present

## 2024-07-21 DIAGNOSIS — Z1211 Encounter for screening for malignant neoplasm of colon: Secondary | ICD-10-CM | POA: Diagnosis not present

## 2024-07-21 DIAGNOSIS — K317 Polyp of stomach and duodenum: Secondary | ICD-10-CM | POA: Diagnosis not present

## 2024-07-21 DIAGNOSIS — K219 Gastro-esophageal reflux disease without esophagitis: Secondary | ICD-10-CM | POA: Diagnosis not present

## 2024-07-21 DIAGNOSIS — Z800 Family history of malignant neoplasm of digestive organs: Secondary | ICD-10-CM | POA: Diagnosis not present

## 2024-07-21 DIAGNOSIS — K2960 Other gastritis without bleeding: Secondary | ICD-10-CM | POA: Diagnosis not present

## 2024-07-21 DIAGNOSIS — I10 Essential (primary) hypertension: Secondary | ICD-10-CM | POA: Diagnosis not present

## 2024-07-28 DIAGNOSIS — G4733 Obstructive sleep apnea (adult) (pediatric): Secondary | ICD-10-CM | POA: Diagnosis not present

## 2024-08-09 ENCOUNTER — Other Ambulatory Visit (HOSPITAL_BASED_OUTPATIENT_CLINIC_OR_DEPARTMENT_OTHER): Payer: Self-pay

## 2024-08-09 ENCOUNTER — Other Ambulatory Visit: Payer: Self-pay | Admitting: Physician Assistant

## 2024-08-09 DIAGNOSIS — I10 Essential (primary) hypertension: Secondary | ICD-10-CM

## 2024-08-09 DIAGNOSIS — J189 Pneumonia, unspecified organism: Secondary | ICD-10-CM

## 2024-08-09 DIAGNOSIS — J9801 Acute bronchospasm: Secondary | ICD-10-CM

## 2024-08-09 MED ORDER — OLMESARTAN MEDOXOMIL-HCTZ 40-25 MG PO TABS
1.0000 | ORAL_TABLET | Freq: Every day | ORAL | 0 refills | Status: AC
  Filled 2024-08-09: qty 90, 90d supply, fill #0

## 2024-08-09 MED ORDER — MONTELUKAST SODIUM 10 MG PO TABS
10.0000 mg | ORAL_TABLET | Freq: Every day | ORAL | 0 refills | Status: AC
  Filled 2024-08-09: qty 90, 90d supply, fill #0

## 2024-08-22 ENCOUNTER — Other Ambulatory Visit (HOSPITAL_BASED_OUTPATIENT_CLINIC_OR_DEPARTMENT_OTHER): Payer: Self-pay

## 2024-08-23 ENCOUNTER — Ambulatory Visit: Admitting: Physician Assistant

## 2024-08-23 ENCOUNTER — Encounter: Payer: Self-pay | Admitting: Physician Assistant

## 2024-08-23 ENCOUNTER — Other Ambulatory Visit (HOSPITAL_BASED_OUTPATIENT_CLINIC_OR_DEPARTMENT_OTHER): Payer: Self-pay

## 2024-08-23 ENCOUNTER — Telehealth (HOSPITAL_BASED_OUTPATIENT_CLINIC_OR_DEPARTMENT_OTHER): Payer: Self-pay | Admitting: Pharmacy Technician

## 2024-08-23 VITALS — BP 122/84 | HR 77 | Temp 97.7°F | Resp 18 | Ht 72.0 in | Wt 281.6 lb

## 2024-08-23 DIAGNOSIS — E669 Obesity, unspecified: Secondary | ICD-10-CM

## 2024-08-23 DIAGNOSIS — I10 Essential (primary) hypertension: Secondary | ICD-10-CM

## 2024-08-23 DIAGNOSIS — E782 Mixed hyperlipidemia: Secondary | ICD-10-CM | POA: Diagnosis not present

## 2024-08-23 MED ORDER — WEGOVY 0.25 MG/0.5ML ~~LOC~~ SOAJ
0.2500 mg | SUBCUTANEOUS | 0 refills | Status: DC
  Filled 2024-08-23: qty 2, 28d supply, fill #0

## 2024-08-23 NOTE — Progress Notes (Signed)
 "  Subjective:  Patient ID: Adam Short, male    DOB: 02-Aug-1986  Age: 39 y.o. MRN: [id number]  Chief Complaint  Patient presents with   Medical Management of Chronic Issues    HPI Pt presents for follow up of hypertension.The patient is tolerating the medication well without side effects. Compliance with treatment has been good; including taking medication as directed , maintains a healthy diet and regular exercise regimen , and following up as directed. Currently taking benicar  hct 40/25 Denies chest pain/sob Bp today stable at 122/84  Pt with hyperlipidemia - he has decided not to take statin and at this time interested in getting started on GLP1 for weight loss to see if that would also help get lipid panel lower      08/23/2024    3:33 PM 02/26/2024    8:13 AM 02/17/2024    3:39 PM 03/07/2023    8:39 AM 01/29/2023    3:59 PM  Depression screen PHQ 2/9  Decreased Interest 0 0 0 0 0  Down, Depressed, Hopeless 0 0 0 0 0  PHQ - 2 Score 0 0 0 0 0        01/29/2023    3:58 PM 03/07/2023    8:39 AM 02/17/2024    3:38 PM 02/26/2024    8:13 AM 08/23/2024    3:33 PM  Fall Risk  Falls in the past year? 0  0 0 0  Was there an injury with Fall? 0  0  0  0  0  Fall Risk Category Calculator 0  0 0 0  Patient at Risk for Falls Due to No Fall Risks No Fall Risks No Fall Risks No Fall Risks No Fall Risks  Fall risk Follow up Falls evaluation completed Falls evaluation completed   Falls evaluation completed     Data saved with a previous flowsheet row definition     ROS CONSTITUTIONAL: Negative for chills, fatigue, fever,  CARDIOVASCULAR: Negative for chest pain, dizziness, palpitations and pedal edema.  RESPIRATORY: Negative for recent cough and dyspnea.  GASTROINTESTINAL: Negative for abdominal pain, acid reflux symptoms, constipation, diarrhea, nausea and vomiting.  PSYCHIATRIC: Negative for sleep disturbance and to question depression screen.  Negative for depression, negative for  anhedonia.   Current Medications[1]  Past Medical History:  Diagnosis Date   Essential hypertension 04/11/2022   Metabolic syndrome 04/11/2022   Objective:  PHYSICAL EXAM:   BP 122/84   Pulse 77   Temp 97.7 F (36.5 C) (Temporal)   Resp 18   Ht 6' (1.829 m)   Wt 281 lb 9.6 oz (127.7 kg)   SpO2 99%   BMI 38.19 kg/m    GEN: Well nourished, well developed, in no acute distress  Cardiac: RRR; no murmurs,  Respiratory:  normal respiratory rate and pattern with no distress - normal breath sounds with no rales, rhonchi, wheezes or rubs GI: normal bowel sounds, no masses or tenderness Psych: euthymic mood, appropriate affect and demeanor  Assessment & Plan:    Essential hypertension Continue current meds as directed Mixed hyperlipidemia Watch diet and decrease fried/fatty foods Obesity (BMI 30-39.9) -     Wegovy ; Inject 0.25 mg into the skin once a week.  Dispense: 2 mL; Refill: 0 Increase water and protein in diet     Follow-up: Return in about 6 months (around 02/20/2025) for  chronic fasting follow-up.  An After Visit Summary was printed and given to the patient.  SARA R Syona Wroblewski, PA-C [facility] [phone]    [1]  Current Outpatient Medications:    cetirizine (ZYRTEC) 10 MG chewable tablet, Chew 10 mg by mouth daily., Disp: , Rfl:    EPINEPHrine  0.3 mg/0.3 mL IJ SOAJ injection, Inject 0.3 mg into the muscle as needed for anaphylaxis., Disp: 2 each, Rfl: 1   hydrocortisone -pramoxine (PROCTOFOAM  HC) rectal foam, Place 1 applicator rectally 2 (two) times daily., Disp: 10 g, Rfl: 2   meloxicam  (MOBIC ) 15 MG tablet, Take 1 tablet (15 mg total) by mouth daily., Disp: 90 tablet, Rfl: 1   montelukast  (SINGULAIR ) 10 MG tablet, Take 1 tablet (10 mg total) by mouth at bedtime., Disp: 90 tablet, Rfl: 0   olmesartan -hydrochlorothiazide  (BENICAR  HCT) 40-25 MG tablet, Take 1 tablet by mouth daily., Disp: 90 tablet, Rfl: 0   ondansetron  (ZOFRAN ) 4 MG tablet, Take 1 tablet  (4 mg total) by mouth every 8 (eight) hours as needed for nausea or vomiting., Disp: 20 tablet, Rfl: 0   pantoprazole  (PROTONIX ) 40 MG tablet, Take 1 tablet (40 mg total) by mouth daily., Disp: 90 tablet, Rfl: 1   semaglutide -weight management (WEGOVY ) 0.25 MG/0.5ML SOAJ SQ injection, Inject 0.25 mg into the skin once a week., Disp: 2 mL, Rfl: 0   traMADol  (ULTRAM ) 50 MG tablet, Take 1 tablet (50 mg total) by mouth every 6 (six) hours as needed for severe pain (7-10), Disp: 20 tablet, Rfl: 0  "

## 2024-08-23 NOTE — Telephone Encounter (Signed)
 Medication: Wegovy  0.25mg  Able to fill? No Prior authorization required? Yes Co-pay before assistance: n/a

## 2024-08-24 ENCOUNTER — Other Ambulatory Visit (HOSPITAL_COMMUNITY): Payer: Self-pay

## 2024-08-24 ENCOUNTER — Other Ambulatory Visit: Payer: Self-pay | Admitting: Physician Assistant

## 2024-08-24 ENCOUNTER — Telehealth (HOSPITAL_BASED_OUTPATIENT_CLINIC_OR_DEPARTMENT_OTHER): Payer: Self-pay

## 2024-08-24 DIAGNOSIS — E669 Obesity, unspecified: Secondary | ICD-10-CM

## 2024-08-24 MED ORDER — WEGOVY 0.25 MG/0.5ML ~~LOC~~ SOAJ: 0.2500 mg | SUBCUTANEOUS | 0 refills | Status: AC

## 2024-08-24 NOTE — Telephone Encounter (Signed)
 Rx has been sent to [facility] pharmacy

## 2024-08-24 NOTE — Telephone Encounter (Signed)
 PA request has been Received. New Encounter has been or will be created for follow up. For additional info see Pharmacy Prior Auth telephone encounter from 08/24/24.

## 2024-08-24 NOTE — Telephone Encounter (Signed)
 Pharmacy Patient Advocate Encounter   Received notification from Pt Calls Messages that prior authorization for Wegovy  0.25 mg/0.5 ml auto injectors is required/requested.   Insurance verification completed.   The patient is insured through [facility].   Per test claim: Per test claim, medication is not covered due to plan/benefit exclusion, PA not submitted at this time  *[employer] doesn't cover Zepbound, Wegovy  or Saxenda for any indication. They will cover Contrave, Qsymia or Phentermine with a prior auth.

## 2024-11-29 ENCOUNTER — Ambulatory Visit: Admitting: Physician Assistant
